# Patient Record
Sex: Female | Born: 1975 | Race: Black or African American | Hispanic: No | Marital: Single | Smoking: Current some day smoker
Health system: Southern US, Community
[De-identification: ages and names within clinical notes are randomized; demographics above are authoritative.]

## PROBLEM LIST (undated history)

## (undated) DIAGNOSIS — F32A Depression, unspecified: Secondary | ICD-10-CM

## (undated) DIAGNOSIS — D509 Iron deficiency anemia, unspecified: Secondary | ICD-10-CM

## (undated) DIAGNOSIS — F329 Major depressive disorder, single episode, unspecified: Secondary | ICD-10-CM

## (undated) DIAGNOSIS — F419 Anxiety disorder, unspecified: Secondary | ICD-10-CM

## (undated) HISTORY — DX: Iron deficiency anemia, unspecified: D50.9

## (undated) HISTORY — DX: Major depressive disorder, single episode, unspecified: F32.9

## (undated) HISTORY — PX: BREAST SURGERY: SHX581

## (undated) HISTORY — DX: Depression, unspecified: F32.A

## (undated) HISTORY — DX: Anxiety disorder, unspecified: F41.9

---

## 2006-07-20 ENCOUNTER — Emergency Department (HOSPITAL_COMMUNITY): Admission: EM | Admit: 2006-07-20 | Discharge: 2006-07-20 | Payer: Self-pay | Admitting: Emergency Medicine

## 2007-02-16 ENCOUNTER — Other Ambulatory Visit: Admission: RE | Admit: 2007-02-16 | Discharge: 2007-02-16 | Payer: Self-pay | Admitting: Obstetrics and Gynecology

## 2007-06-10 HISTORY — PX: MYOMECTOMY: SHX85

## 2007-09-10 ENCOUNTER — Other Ambulatory Visit: Admission: RE | Admit: 2007-09-10 | Discharge: 2007-09-10 | Payer: Self-pay | Admitting: Obstetrics and Gynecology

## 2008-06-09 HISTORY — PX: BOWEL RESECTION: SHX1257

## 2008-06-09 HISTORY — PX: OVARIAN CYST SURGERY: SHX726

## 2008-11-02 ENCOUNTER — Other Ambulatory Visit: Admission: RE | Admit: 2008-11-02 | Discharge: 2008-11-02 | Payer: Self-pay | Admitting: Obstetrics and Gynecology

## 2008-11-13 ENCOUNTER — Encounter: Admission: RE | Admit: 2008-11-13 | Discharge: 2008-11-13 | Payer: Self-pay | Admitting: Obstetrics and Gynecology

## 2008-12-27 ENCOUNTER — Encounter: Admission: RE | Admit: 2008-12-27 | Discharge: 2008-12-27 | Payer: Self-pay | Admitting: Gastroenterology

## 2008-12-28 ENCOUNTER — Encounter: Admission: RE | Admit: 2008-12-28 | Discharge: 2008-12-28 | Payer: Self-pay | Admitting: Gastroenterology

## 2009-05-07 ENCOUNTER — Other Ambulatory Visit: Admission: RE | Admit: 2009-05-07 | Discharge: 2009-05-07 | Payer: Self-pay | Admitting: Obstetrics and Gynecology

## 2009-05-24 ENCOUNTER — Ambulatory Visit (HOSPITAL_COMMUNITY): Admission: RE | Admit: 2009-05-24 | Discharge: 2009-05-24 | Payer: Self-pay | Admitting: Obstetrics and Gynecology

## 2009-07-13 ENCOUNTER — Ambulatory Visit (HOSPITAL_COMMUNITY): Admission: RE | Admit: 2009-07-13 | Discharge: 2009-07-13 | Payer: Self-pay | Admitting: Obstetrics and Gynecology

## 2009-10-15 ENCOUNTER — Other Ambulatory Visit: Admission: RE | Admit: 2009-10-15 | Discharge: 2009-10-15 | Payer: Self-pay | Admitting: Obstetrics and Gynecology

## 2010-06-30 ENCOUNTER — Encounter: Payer: Self-pay | Admitting: Obstetrics and Gynecology

## 2012-07-02 ENCOUNTER — Ambulatory Visit: Payer: BC Managed Care – PPO

## 2012-07-02 ENCOUNTER — Ambulatory Visit (INDEPENDENT_AMBULATORY_CARE_PROVIDER_SITE_OTHER): Payer: BC Managed Care – PPO | Admitting: Family Medicine

## 2012-07-02 VITALS — BP 118/80 | HR 72 | Temp 98.1°F | Resp 16 | Ht 63.0 in | Wt 159.8 lb

## 2012-07-02 DIAGNOSIS — R52 Pain, unspecified: Secondary | ICD-10-CM

## 2012-07-02 DIAGNOSIS — R079 Chest pain, unspecified: Secondary | ICD-10-CM

## 2012-07-02 DIAGNOSIS — D649 Anemia, unspecified: Secondary | ICD-10-CM

## 2012-07-02 LAB — POCT CBC
Granulocyte percent: 50.6 % (ref 37–80)
HCT, POC: 37.5 % — AB (ref 37.7–47.9)
Hemoglobin: 11.5 g/dL — AB (ref 12.2–16.2)
Lymph, poc: 2.3 (ref 0.6–3.4)
MCH, POC: 26.4 pg — AB (ref 27–31.2)
MCHC: 30.7 g/dL — AB (ref 31.8–35.4)
MCV: 86.1 fL (ref 80–97)
MID (cbc): 0.3 (ref 0–0.9)
MPV: 6.7 fL (ref 0–99.8)
POC Granulocyte: 2.7 (ref 2–6.9)
POC LYMPH PERCENT: 43.5 % (ref 10–50)
POC MID %: 5.9 %M (ref 0–12)
Platelet Count, POC: 368 10*3/uL (ref 142–424)
RBC: 4.35 M/uL (ref 4.04–5.48)
RDW, POC: 14.9 %
WBC: 5.4 10*3/uL (ref 4.6–10.2)

## 2012-07-02 NOTE — Patient Instructions (Addendum)
Chest Pain (Nonspecific) It is often hard to give a specific diagnosis for the cause of chest pain. There is always a chance that your pain could be related to something serious, such as a heart attack or a blood clot in the lungs. You need to follow up with your caregiver for further evaluation. CAUSES   Heartburn.  Pneumonia or bronchitis.  Anxiety or stress.  Inflammation around your heart (pericarditis) or lung (pleuritis or pleurisy).  A blood clot in the lung.  A collapsed lung (pneumothorax). It can develop suddenly on its own (spontaneous pneumothorax) or from injury (trauma) to the chest.  Shingles infection (herpes zoster virus). The chest wall is composed of bones, muscles, and cartilage. Any of these can be the source of the pain.  The bones can be bruised by injury.  The muscles or cartilage can be strained by coughing or overwork.  The cartilage can be affected by inflammation and become sore (costochondritis). DIAGNOSIS  Lab tests or other studies, such as X-rays, electrocardiography, stress testing, or cardiac imaging, may be needed to find the cause of your pain.  TREATMENT   Treatment depends on what may be causing your chest pain. Treatment may include:  Acid blockers for heartburn.  Anti-inflammatory medicine.  Pain medicine for inflammatory conditions.  Antibiotics if an infection is present.  You may be advised to change lifestyle habits. This includes stopping smoking and avoiding alcohol, caffeine, and chocolate.  You may be advised to keep your head raised (elevated) when sleeping. This reduces the chance of acid going backward from your stomach into your esophagus.  Most of the time, nonspecific chest pain will improve within 2 to 3 days with rest and mild pain medicine. HOME CARE INSTRUCTIONS   If antibiotics were prescribed, take your antibiotics as directed. Finish them even if you start to feel better.  For the next few days, avoid physical  activities that bring on chest pain. Continue physical activities as directed.  Do not smoke.  Avoid drinking alcohol.  Only take over-the-counter or prescription medicine for pain, discomfort, or fever as directed by your caregiver.  Follow your caregiver's suggestions for further testing if your chest pain does not go away.  Keep any follow-up appointments you made. If you do not go to an appointment, you could develop lasting (chronic) problems with pain. If there is any problem keeping an appointment, you must call to reschedule. SEEK MEDICAL CARE IF:   You think you are having problems from the medicine you are taking. Read your medicine instructions carefully.  Your chest pain does not go away, even after treatment.  You develop a rash with blisters on your chest. SEEK IMMEDIATE MEDICAL CARE IF:   You have increased chest pain or pain that spreads to your arm, neck, jaw, back, or abdomen.  You develop shortness of breath, an increasing cough, or you are coughing up blood.  You have severe back or abdominal pain, feel nauseous, or vomit.  You develop severe weakness, fainting, or chills.  You have a fever. THIS IS AN EMERGENCY. Do not wait to see if the pain will go away. Get medical help at once. Call your local emergency services (911 in [place] Do not drive yourself to the hospital. MAKE SURE YOU:   Understand these instructions.  Will watch your condition.  Will get help right away if you are not doing well or get worse. Document Released: 03/05/2005 Document Revised: 08/18/2011 Document Reviewed: 12/30/2007 Exit[employer] Patient Information 2013 [employer].  

## 2012-07-02 NOTE — Progress Notes (Signed)
[facility]:  Office Visit  Chief Complaint:  Chief Complaint  Patient presents with  . Back Pain    x 1 week  . Chest Pain    x 1 week  . rib pain  . Neck Pain    HPI: Emily Erickson is a 37 y.o. female who complains of  Back pain from bottom of shoulder, to neck to throat ( sore throat). Last night she had pain on the left side of her ribs, her pain in the shoulders radiates down arm. Midsternal CP. Pain is described as dull and achey, intemittent x 1 week. Throbbing pain. She denies diaphoressis, palpitations, numbness/tingling, jaw pain, nausea, vomiting, abd pain . She deneis any MI risk factors ie HTN, XOL, premature MIs in family history. She does not smoke. She drinks socially. She denies any repetitive motions/new exercises. She is a college Retail buyer at [employer] . Has not tried anything for this. She does have a history of anxiety and the CP can come on during her anxiety attacks but this occurred only twice last week. NO new rashes, no insect/tick bites.   Intermittent CP, and pain all over her upper body as above.  1 week history. No new exercises. No lifting anything heavy.  Occurs more during later in the day, as day progesses.  No  courghing, no fevers, chills, no URI sxs. No new abdominal issues No CAD risk factors.    Past Medical History  Diagnosis Date  . Anxiety   . Depression    Past Surgical History  Procedure Date  . Breast surgery   . Myomectomy 2009  . Ovarian cyst surgery 2010  . Bowel resection 2010   History   Social History  . Marital Status: Single    Spouse Name: N/A    Number of Children: N/A  . Years of Education: N/A   Social History Main Topics  . Smoking status: Current Some Day Smoker  . Smokeless tobacco: None  . Alcohol Use: 0.0 oz/week    3-5 drink(s) per week  . Drug Use: No  . Sexually Active:    Other Topics Concern  . None   Social History Narrative  . None   Family History  Problem  Relation Age of Onset  . Hypertension Father    No Known Allergies Prior to Admission medications   Medication Sig Start Date End Date Taking? Authorizing Provider  ALPRAZolam (XANAX) 0.25 MG tablet Take 0.25 mg by mouth at bedtime as needed.   Yes Historical Provider, MD     ROS: The patient denies fevers, chills, night sweats, unintentional weight loss, chest pain, palpitations, wheezing, dyspnea on exertion, nausea, vomiting, abdominal pain, dysuria, hematuria, melena, numbness, weakness, or tingling.   All other systems have been reviewed and were otherwise negative with the exception of those mentioned in the HPI and as above.    PHYSICAL EXAM: Filed Vitals:   07/02/12 1804  BP: 118/80  Pulse: 72  Temp: 98.1 F (36.7 C)  Resp: 16   Filed Vitals:   07/02/12 1804  Height: 5\' 3"  (1.6 m)  Weight: 159 lb 12.8 oz (72.485 kg)  SPo2   98% Body mass index is 28.31 kg/(m^2).  General: Alert, no acute distress HEENT:  Normocephalic, atraumatic, oropharynx patent. EOMI, PERRLA, fundoscopic exam nl. Cardiovascular:  Regular rate and rhythm, no rubs murmurs or gallops.  No Carotid bruits, radial pulse intact. No pedal edema.  Respiratory: Clear to auscultation bilaterally.  No  wheezes, rales, or rhonchi.  No cyanosis, no use of accessory musculature GI: No organomegaly, abdomen is soft and non-tender, positive bowel sounds.  No masses. Skin: No rashes. Neurologic: Facial musculature symmetric. CN 2-12 grossly intact Psychiatric: Patient is appropriate throughout our interaction. Lymphatic: No cervical lymphadenopathy Musculoskeletal: Gait intact. Neck exam-normal ROM, 5/5 strength Shoulder bilateral-normal Thoracic exam-bl, tender at trapezius  LABS: Results for orders placed in visit on 07/02/12  POCT CBC      Component Value Range   WBC 5.4  4.6 - 10.2 K/uL   Lymph, poc 2.3  0.6 - 3.4   POC LYMPH PERCENT 43.5  10 - 50 %L   MID (cbc) 0.3  0 - 0.9   POC MID % 5.9  0 - 12 %M    POC Granulocyte 2.7  2 - 6.9   Granulocyte percent 50.6  37 - 80 %G   RBC 4.35  4.04 - 5.48 M/uL   Hemoglobin 11.5 (*) 12.2 - 16.2 g/dL   HCT, POC 78.2 (*) 95.6 - 47.9 %   MCV 86.1  80 - 97 fL   MCH, POC 26.4 (*) 27 - 31.2 pg   MCHC 30.7 (*) 31.8 - 35.4 g/dL   RDW, POC 21.3     Platelet Count, POC 368  142 - 424 K/uL   MPV 6.7  0 - 99.8 fL     EKG/XRAY:   Primary read interpreted by Dr. Conley Rolls at [facility] No acute cardiopulmonary process EKG NSR  ASSESSMENT/PLAN: Encounter Diagnoses  Name Primary?  . Chest pain Yes  . Anemia    Msk pain x 1 week ? Etiology  Chest pain-evaluate anemia vs anxiety. EKG and CXR did not show anything actue Labs pending: CMP,  iron studies CXR and EKG normal F/u prn    Mikel Hardgrove PHUONG, DO 07/02/2012 6:55 PM

## 2012-07-03 LAB — COMPREHENSIVE METABOLIC PANEL WITH GFR
ALT: 17 U/L (ref 0–35)
Albumin: 4.2 g/dL (ref 3.5–5.2)
Alkaline Phosphatase: 103 U/L (ref 39–117)
BUN: 11 mg/dL (ref 6–23)
CO2: 24 meq/L (ref 19–32)
Calcium: 9.5 mg/dL (ref 8.4–10.5)
Creat: 0.72 mg/dL (ref 0.50–1.10)
Glucose, Bld: 78 mg/dL (ref 70–99)
Total Bilirubin: 0.2 mg/dL — ABNORMAL LOW (ref 0.3–1.2)

## 2012-07-03 LAB — IRON AND TIBC
%SAT: 13 % — ABNORMAL LOW (ref 20–55)
Iron: 49 ug/dL (ref 42–145)
TIBC: 370 ug/dL (ref 250–470)
UIBC: 321 ug/dL (ref 125–400)

## 2012-07-03 LAB — COMPREHENSIVE METABOLIC PANEL
AST: 17 U/L (ref 0–37)
Chloride: 105 mEq/L (ref 96–112)
Potassium: 3.8 mEq/L (ref 3.5–5.3)
Sodium: 137 mEq/L (ref 135–145)
Total Protein: 6.9 g/dL (ref 6.0–8.3)

## 2012-07-03 LAB — FERRITIN: Ferritin: 7 ng/mL — ABNORMAL LOW (ref 10–291)

## 2012-07-14 ENCOUNTER — Telehealth: Payer: Self-pay | Admitting: Family Medicine

## 2012-07-14 NOTE — Telephone Encounter (Signed)
LM to call me back about labs. CMP was normal. Her iron studies were low. She needs iron supplements ( iron 325 mg daily)  for 2 months and then repeat CBC/iron studies.

## 2012-08-23 ENCOUNTER — Encounter: Payer: Self-pay | Admitting: Family Medicine

## 2013-01-11 ENCOUNTER — Ambulatory Visit (INDEPENDENT_AMBULATORY_CARE_PROVIDER_SITE_OTHER): Payer: BC Managed Care – PPO | Admitting: Family Medicine

## 2013-01-11 VITALS — BP 120/88 | HR 82 | Temp 98.1°F | Resp 16 | Ht 64.0 in | Wt 163.8 lb

## 2013-01-11 DIAGNOSIS — T148XXA Other injury of unspecified body region, initial encounter: Secondary | ICD-10-CM

## 2013-01-11 DIAGNOSIS — M542 Cervicalgia: Secondary | ICD-10-CM

## 2013-01-11 MED ORDER — TRAMADOL HCL 50 MG PO TABS: 50.0000 mg | ORAL_TABLET | Freq: Three times a day (TID) | ORAL | Status: DC | PRN

## 2013-01-11 MED ORDER — CYCLOBENZAPRINE HCL 5 MG PO TABS: 5.0000 mg | ORAL_TABLET | Freq: Every evening | ORAL | Status: DC | PRN

## 2013-01-11 MED ORDER — NAPROXEN 500 MG PO TABS: 500.0000 mg | ORAL_TABLET | Freq: Two times a day (BID) | ORAL | Status: DC
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
## 2013-01-11 NOTE — Patient Instructions (Signed)
Cervical Strain and Sprain (Whiplash) with Rehab Cervical strain and sprains are injuries that commonly occur with "whiplash" injuries. Whiplash occurs when the neck is forcefully whipped backward or forward, such as during a motor vehicle accident. The muscles, ligaments, tendons, discs and nerves of the neck are susceptible to injury when this occurs. SYMPTOMS   Pain or stiffness in the front and/or back of neck  Symptoms may present immediately or up to 24 hours after injury.  Dizziness, headache, nausea and vomiting.  Muscle spasm with soreness and stiffness in the neck.  Tenderness and swelling at the injury site. CAUSES  Whiplash injuries often occur during contact sports or motor vehicle accidents.  RISK INCREASES WITH:  Osteoarthritis of the spine.  Situations that make head or neck accidents or trauma more likely.  High-risk sports (football, rugby, wrestling, hockey, auto racing, gymnastics, diving, contact karate or boxing).  Poor strength and flexibility of the neck.  Previous neck injury.  Poor tackling technique.  Improperly fitted or padded equipment. PREVENTION  Learn and use proper technique (avoid tackling with the head, spearing and head-butting; use proper falling techniques to avoid landing on the head).  Warm up and stretch properly before activity.  Maintain physical fitness:  Strength, flexibility and endurance.  Cardiovascular fitness.  Wear properly fitted and padded protective equipment, such as padded soft collars, for participation in contact sports. PROGNOSIS  Recovery for cervical strain and sprain injuries is dependent on the extent of the injury. These injuries are usually curable in 1 week to 3 months with appropriate treatment.  RELATED COMPLICATIONS   Temporary numbness and weakness may occur if the nerve roots are damaged, and this may persist until the nerve has completely healed.  Chronic pain due to frequent recurrence of  symptoms.  Prolonged healing, especially if activity is resumed too soon (before complete recovery). TREATMENT  Treatment initially involves the use of ice and medication to help reduce pain and inflammation. It is also important to perform strengthening and stretching exercises and modify activities that worsen symptoms so the injury does not get worse. These exercises may be performed at home or with a therapist. For patients who experience severe symptoms, a soft padded collar may be recommended to be worn around the neck.  Improving your posture may help reduce symptoms. Posture improvement includes pulling your chin and abdomen in while sitting or standing. If you are sitting, sit in a firm chair with your buttocks against the back of the chair. While sleeping, try replacing your pillow with a small towel rolled to 2 inches in diameter, or use a cervical pillow or soft cervical collar. Poor sleeping positions delay healing.  For patients with nerve root damage, which causes numbness or weakness, the use of a cervical traction apparatus may be recommended. Surgery is rarely necessary for these injuries. However, cervical strain and sprains that are present at birth (congenital) may require surgery. MEDICATION   If pain medication is necessary, nonsteroidal anti-inflammatory medications, such as aspirin and ibuprofen, or other minor pain relievers, such as acetaminophen, are often recommended.  Do not take pain medication for 7 days before surgery.  Prescription pain relievers may be given if deemed necessary by your caregiver. Use only as directed and only as much as you need. HEAT AND COLD:   Cold treatment (icing) relieves pain and reduces inflammation. Cold treatment should be applied for 10 to 15 minutes every 2 to 3 hours for inflammation and pain and immediately after any activity that   aggravates your symptoms. Use ice packs or an ice massage.  Heat treatment may be used prior to  performing the stretching and strengthening activities prescribed by your caregiver, physical therapist, or athletic trainer. Use a heat pack or a warm soak. SEEK MEDICAL CARE IF:   Symptoms get worse or do not improve in 2 weeks despite treatment.  New, unexplained symptoms develop (drugs used in treatment may produce side effects). EXERCISES RANGE OF MOTION (ROM) AND STRETCHING EXERCISES - Cervical Strain and Sprain These exercises may help you when beginning to rehabilitate your injury. In order to successfully resolve your symptoms, you must improve your posture. These exercises are designed to help reduce the forward-head and rounded-shoulder posture which contributes to this condition. Your symptoms may resolve with or without further involvement from your physician, physical therapist or athletic trainer. While completing these exercises, remember:   Restoring tissue flexibility helps normal motion to return to the joints. This allows healthier, less painful movement and activity.  An effective stretch should be held for at least 20 seconds, although you may need to begin with shorter hold times for comfort.  A stretch should never be painful. You should only feel a gentle lengthening or release in the stretched tissue. STRETCH- Axial Extensors  Lie on your back on the floor. You may bend your knees for comfort. Place a rolled up hand towel or dish towel, about 2 inches in diameter, under the part of your head that makes contact with the floor.  Gently tuck your chin, as if trying to make a "double chin," until you feel a gentle stretch at the base of your head.  Hold __________ seconds. Repeat __________ times. Complete this exercise __________ times per day.  STRETECH - Axial Extension   Stand or sit on a firm surface. Assume a good posture: chest up, shoulders drawn back, abdominal muscles slightly tense, knees unlocked (if standing) and feet hip width apart.  Slowly retract your  chin so your head slides back and your chin slightly lowers.Continue to look straight ahead.  You should feel a gentle stretch in the back of your head. Be certain not to feel an aggressive stretch since this can cause headaches later.  Hold for __________ seconds. Repeat __________ times. Complete this exercise __________ times per day. STRETCH  Cervical Side Bend   Stand or sit on a firm surface. Assume a good posture: chest up, shoulders drawn back, abdominal muscles slightly tense, knees unlocked (if standing) and feet hip width apart.  Without letting your nose or shoulders move, slowly tip your right / left ear to your shoulder until your feel a gentle stretch in the muscles on the opposite side of your neck.  Hold __________ seconds. Repeat __________ times. Complete this exercise __________ times per day. STRETCH  Cervical Rotators   Stand or sit on a firm surface. Assume a good posture: chest up, shoulders drawn back, abdominal muscles slightly tense, knees unlocked (if standing) and feet hip width apart.  Keeping your eyes level with the ground, slowly turn your head until you feel a gentle stretch along the back and opposite side of your neck.  Hold __________ seconds. Repeat __________ times. Complete this exercise __________ times per day. RANGE OF MOTION - Neck Circles   Stand or sit on a firm surface. Assume a good posture: chest up, shoulders drawn back, abdominal muscles slightly tense, knees unlocked (if standing) and feet hip width apart.  Gently roll your head down and around from   the back of one shoulder to the back of the other. The motion should never be forced or painful.  Repeat the motion 10-20 times, or until you feel the neck muscles relax and loosen. Repeat __________ times. Complete the exercise __________ times per day. STRENGTHENING EXERCISES - Cervical Strain and Sprain These exercises may help you when beginning to rehabilitate your injury. They may  resolve your symptoms with or without further involvement from your physician, physical therapist or athletic trainer. While completing these exercises, remember:   Muscles can gain both the endurance and the strength needed for everyday activities through controlled exercises.  Complete these exercises as instructed by your physician, physical therapist or athletic trainer. Progress the resistance and repetitions only as guided.  You may experience muscle soreness or fatigue, but the pain or discomfort you are trying to eliminate should never worsen during these exercises. If this pain does worsen, stop and make certain you are following the directions exactly. If the pain is still present after adjustments, discontinue the exercise until you can discuss the trouble with your clinician. STRENGTH Cervical Flexors, Isometric  Face a wall, standing about 6 inches away. Place a small pillow, a ball about 6-8 inches in diameter, or a folded towel between your forehead and the wall.  Slightly tuck your chin and gently push your forehead into the soft object. Push only with mild to moderate intensity, building up tension gradually. Keep your jaw and forehead relaxed.  Hold 10 to 20 seconds. Keep your breathing relaxed.  Release the tension slowly. Relax your neck muscles completely before you start the next repetition. Repeat __________ times. Complete this exercise __________ times per day. STRENGTH- Cervical Lateral Flexors, Isometric   Stand about 6 inches away from a wall. Place a small pillow, a ball about 6-8 inches in diameter, or a folded towel between the side of your head and the wall.  Slightly tuck your chin and gently tilt your head into the soft object. Push only with mild to moderate intensity, building up tension gradually. Keep your jaw and forehead relaxed.  Hold 10 to 20 seconds. Keep your breathing relaxed.  Release the tension slowly. Relax your neck muscles completely before  you start the next repetition. Repeat __________ times. Complete this exercise __________ times per day. STRENGTH  Cervical Extensors, Isometric   Stand about 6 inches away from a wall. Place a small pillow, a ball about 6-8 inches in diameter, or a folded towel between the back of your head and the wall.  Slightly tuck your chin and gently tilt your head back into the soft object. Push only with mild to moderate intensity, building up tension gradually. Keep your jaw and forehead relaxed.  Hold 10 to 20 seconds. Keep your breathing relaxed.  Release the tension slowly. Relax your neck muscles completely before you start the next repetition. Repeat __________ times. Complete this exercise __________ times per day. POSTURE AND BODY MECHANICS CONSIDERATIONS - Cervical Strain and Sprain Keeping correct posture when sitting, standing or completing your activities will reduce the stress put on different body tissues, allowing injured tissues a chance to heal and limiting painful experiences. The following are general guidelines for improved posture. Your physician or physical therapist will provide you with any instructions specific to your needs. While reading these guidelines, remember:  The exercises prescribed by your provider will help you have the flexibility and strength to maintain correct postures.  The correct posture provides the optimal environment for your joints to   work. All of your joints have less wear and tear when properly supported by a spine with good posture. This means you will experience a healthier, less painful body.  Correct posture must be practiced with all of your activities, especially prolonged sitting and standing. Correct posture is as important when doing repetitive low-stress activities (typing) as it is when doing a single heavy-load activity (lifting). PROLONGED STANDING WHILE SLIGHTLY LEANING FORWARD When completing a task that requires you to lean forward while  standing in one place for a long time, place either foot up on a stationary 2-4 inch high object to help maintain the best posture. When both feet are on the ground, the low back tends to lose its slight inward curve. If this curve flattens (or becomes too large), then the back and your other joints will experience too much stress, fatigue more quickly and can cause pain.  RESTING POSITIONS Consider which positions are most painful for you when choosing a resting position. If you have pain with flexion-based activities (sitting, bending, stooping, squatting), choose a position that allows you to rest in a less flexed posture. You would want to avoid curling into a fetal position on your side. If your pain worsens with extension-based activities (prolonged standing, working overhead), avoid resting in an extended position such as sleeping on your stomach. Most people will find more comfort when they rest with their spine in a more neutral position, neither too rounded nor too arched. Lying on a non-sagging bed on your side with a pillow between your knees, or on your back with a pillow under your knees will often provide some relief. Keep in mind, being in any one position for a prolonged period of time, no matter how correct your posture, can still lead to stiffness. WALKING Walk with an upright posture. Your ears, shoulders and hips should all line-up. OFFICE WORK When working at a desk, create an environment that supports good, upright posture. Without extra support, muscles fatigue and lead to excessive strain on joints and other tissues. CHAIR:  A chair should be able to slide under your desk when your back makes contact with the back of the chair. This allows you to work closely.  The chair's height should allow your eyes to be level with the upper part of your monitor and your hands to be slightly lower than your elbows.  Body position:  Your feet should make contact with the floor. If this is  not possible, use a foot rest.  Keep your ears over your shoulders. This will reduce stress on your neck and low back. Document Released: 05/26/2005 Document Revised: 08/18/2011 Document Reviewed: 09/07/2008 [employer] Patient Information 2014 [employer].  

## 2013-01-19 ENCOUNTER — Other Ambulatory Visit: Payer: Self-pay | Admitting: *Deleted

## 2013-01-19 DIAGNOSIS — T148XXA Other injury of unspecified body region, initial encounter: Secondary | ICD-10-CM

## 2013-01-19 DIAGNOSIS — M542 Cervicalgia: Secondary | ICD-10-CM

## 2013-01-19 MED ORDER — NAPROXEN 500 MG PO TABS: 500.0000 mg | ORAL_TABLET | Freq: Two times a day (BID) | ORAL | Status: DC

## 2013-01-19 MED ORDER — CYCLOBENZAPRINE HCL 5 MG PO TABS: 5.0000 mg | ORAL_TABLET | Freq: Every evening | ORAL | Status: DC | PRN

## 2013-01-25 ENCOUNTER — Ambulatory Visit: Payer: BC Managed Care – PPO

## 2013-01-25 ENCOUNTER — Ambulatory Visit (INDEPENDENT_AMBULATORY_CARE_PROVIDER_SITE_OTHER): Payer: BC Managed Care – PPO | Admitting: Family Medicine

## 2013-01-25 VITALS — BP 138/84 | HR 93 | Temp 98.4°F | Resp 18 | Wt 164.0 lb

## 2013-01-25 DIAGNOSIS — T148XXA Other injury of unspecified body region, initial encounter: Secondary | ICD-10-CM

## 2013-01-25 DIAGNOSIS — M436 Torticollis: Secondary | ICD-10-CM
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
## 2013-02-28 DIAGNOSIS — Z0271 Encounter for disability determination: Secondary | ICD-10-CM

## 2013-04-21 ENCOUNTER — Other Ambulatory Visit: Payer: Self-pay | Admitting: Obstetrics & Gynecology

## 2013-04-21 DIAGNOSIS — D259 Leiomyoma of uterus, unspecified: Secondary | ICD-10-CM

## 2013-04-30 ENCOUNTER — Other Ambulatory Visit: Payer: Self-pay

## 2013-05-03 ENCOUNTER — Telehealth: Payer: Self-pay

## 2013-05-03 DIAGNOSIS — T148XXA Other injury of unspecified body region, initial encounter: Secondary | ICD-10-CM

## 2013-05-03 DIAGNOSIS — M542 Cervicalgia: Secondary | ICD-10-CM

## 2013-05-03 NOTE — Telephone Encounter (Signed)
Please advise 

## 2013-05-03 NOTE — Telephone Encounter (Signed)
Dr Conley Rolls  Patient is requesting tramadol   [phone]  [facility]

## 2013-05-06 MED ORDER — TRAMADOL HCL 50 MG PO TABS: 50.0000 mg | ORAL_TABLET | Freq: Three times a day (TID) | ORAL | Status: DC | PRN

## 2013-05-06 NOTE — Telephone Encounter (Signed)
Done

## 2013-06-20 ENCOUNTER — Telehealth: Payer: Self-pay

## 2013-06-20 NOTE — Telephone Encounter (Signed)
[facility] Attending Physician's Report in Dr. Gus Puma box to be completed.

## 2013-06-30 NOTE — Telephone Encounter (Signed)
[facility] did not received ppwk so they sent another copy which is in Dr. Gus Puma box to be completed.

## 2013-06-30 NOTE — Telephone Encounter (Signed)
Ppwk completed. Faxed copy to [facility]. [place] and left patient a copy in pickup drawer.

## 2013-07-11 ENCOUNTER — Ambulatory Visit (INDEPENDENT_AMBULATORY_CARE_PROVIDER_SITE_OTHER): Payer: BC Managed Care – PPO | Admitting: Family Medicine

## 2013-07-11 VITALS — BP 120/80 | HR 89 | Temp 98.2°F | Resp 16 | Ht 64.0 in | Wt 151.0 lb

## 2013-07-11 DIAGNOSIS — M542 Cervicalgia: Secondary | ICD-10-CM | POA: Diagnosis not present

## 2013-07-11 DIAGNOSIS — T148XXA Other injury of unspecified body region, initial encounter: Secondary | ICD-10-CM | POA: Diagnosis not present

## 2013-07-11 MED ORDER — DICLOFENAC SODIUM 1 % TD GEL: 2.0000 g | Freq: Four times a day (QID) | TRANSDERMAL | Status: DC

## 2013-07-11 MED ORDER — METHOCARBAMOL 500 MG PO TABS: 500.0000 mg | ORAL_TABLET | Freq: Three times a day (TID) | ORAL | Status: DC | PRN

## 2013-07-11 MED ORDER — HYDROCODONE-ACETAMINOPHEN 5-325 MG PO TABS: 1.0000 | ORAL_TABLET | Freq: Every evening | ORAL | Status: DC | PRN
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
## 2013-07-11 NOTE — Progress Notes (Deleted)
Chief Complaint:  Chief Complaint  Patient presents with  . Follow-up    Neck pain now in front of neck    HPI: Emily Erickson is a 38 y.o. female who is here for  ***  Past Medical History  Diagnosis Date  . Anxiety   . Depression    Past Surgical History  Procedure Laterality Date  . Breast surgery    . Myomectomy  2009  . Ovarian cyst surgery  2010  . Bowel resection  2010   History   Social History  . Marital Status: Single    Spouse Name: N/A    Number of Children: N/A  . Years of Education: N/A   Social History Main Topics  . Smoking status: Current Some Day Smoker  . Smokeless tobacco: None  . Alcohol Use: 0.0 oz/week    3-5 drink(s) per week  . Drug Use: No  . Sexual Activity: None   Other Topics Concern  . None   Social History Narrative  . None   Family History  Problem Relation Age of Onset  . Hypertension Father    No Known Allergies Prior to Admission medications   Medication Sig Start Date End Date Taking? Authorizing Provider  ALPRAZolam (XANAX) 0.25 MG tablet Take 0.25 mg by mouth at bedtime as needed.   Yes Historical Provider, MD  traMADol (ULTRAM) 50 MG tablet Take 1 tablet (50 mg total) by mouth every 8 (eight) hours as needed. 05/03/13  Yes Thao P Le, DO  cyclobenzaprine (FLEXERIL) 5 MG tablet Take 1 tablet (5 mg total) by mouth at bedtime as needed. 01/19/13   Thao P Le, DO  naproxen (NAPROSYN) 500 MG tablet Take 1 tablet (500 mg total) by mouth 2 (two) times daily with a meal. 01/19/13   Thao P Le, DO     ROS: The patient denies fevers, chills, night sweats, unintentional weight loss, chest pain, palpitations, wheezing, dyspnea on exertion, nausea, vomiting, abdominal pain, dysuria, hematuria, melena, numbness, weakness, or tingling. ***  All other systems have been reviewed and were otherwise negative with the exception of those mentioned in the HPI and as above.    PHYSICAL EXAM: Filed Vitals:   07/11/13 1649  BP: 120/80    Pulse: 89  Temp: 98.2 F (36.8 C)  Resp: 16   Filed Vitals:   07/11/13 1649  Height: 5\' 4"  (1.626 m)  Weight: 151 lb (68.493 kg)   Body mass index is 25.91 kg/(m^2).  General: Alert, no acute distress HEENT:  Normocephalic, atraumatic, oropharynx patent. EOMI, PERRLA Cardiovascular:  Regular rate and rhythm, no rubs murmurs or gallops.  No Carotid bruits, radial pulse intact. No pedal edema.  Respiratory: Clear to auscultation bilaterally.  No wheezes, rales, or rhonchi.  No cyanosis, no use of accessory musculature GI: No organomegaly, abdomen is soft and non-tender, positive bowel sounds.  No masses. Skin: No rashes. Neurologic: Facial musculature symmetric. Psychiatric: Patient is appropriate throughout our interaction. Lymphatic: No cervical lymphadenopathy Musculoskeletal: Gait intact.   LABS: Results for orders placed in visit on 07/02/12  COMPREHENSIVE METABOLIC PANEL      Result Value Range   Sodium 137  135 - 145 mEq/L   Potassium 3.8  3.5 - 5.3 mEq/L   Chloride 105  96 - 112 mEq/L   CO2 24  19 - 32 mEq/L   Glucose, Bld 78  70 - 99 mg/dL   BUN 11  6 - 23 mg/dL   Creat  0.72  0.50 - 1.10 mg/dL   Total Bilirubin 0.2 (*) 0.3 - 1.2 mg/dL   Alkaline Phosphatase 103  39 - 117 U/L   AST 17  0 - 37 U/L   ALT 17  0 - 35 U/L   Total Protein 6.9  6.0 - 8.3 g/dL   Albumin 4.2  3.5 - 5.2 g/dL   Calcium 9.5  8.4 - 10.5 mg/dL  FERRITIN      Result Value Range   Ferritin 7 (*) 10 - 291 ng/mL  IRON AND TIBC      Result Value Range   Iron 49  42 - 145 ug/dL   UIBC 321  125 - 400 ug/dL   TIBC 370  250 - 470 ug/dL   %SAT 13 (*) 20 - 55 %  POCT CBC      Result Value Range   WBC 5.4  4.6 - 10.2 K/uL   Lymph, poc 2.3  0.6 - 3.4   POC LYMPH PERCENT 43.5  10 - 50 %L   MID (cbc) 0.3  0 - 0.9   POC MID % 5.9  0 - 12 %M   POC Granulocyte 2.7  2 - 6.9   Granulocyte percent 50.6  37 - 80 %G   RBC 4.35  4.04 - 5.48 M/uL   Hemoglobin 11.5 (*) 12.2 - 16.2 g/dL   HCT, POC 37.5  (*) 37.7 - 47.9 %   MCV 86.1  80 - 97 fL   MCH, POC 26.4 (*) 27 - 31.2 pg   MCHC 30.7 (*) 31.8 - 35.4 g/dL   RDW, POC 14.9     Platelet Count, POC 368  142 - 424 K/uL   MPV 6.7  0 - 99.8 fL     EKG/XRAY:   Primary read interpreted by Dr. Marin Comment at [facility].   ASSESSMENT/PLAN: No diagnosis found.   Gross sideeffects, risk and benefits, and alternatives of medications d/w patient. Patient is aware that all medications have potential sideeffects and we are unable to predict every sideeffect or drug-drug interaction that may occur.  Dan Humphreys, [facility] 07/11/2013 4:51 PM

## 2013-07-14 ENCOUNTER — Telehealth: Payer: Self-pay

## 2013-07-14 NOTE — Telephone Encounter (Signed)
PA needed for voltaren gel. Completed on covermymeds.

## 2013-07-19 NOTE — Telephone Encounter (Signed)
PA was denied. I have put denial letter in Dr Gus Puma box for review.

## 2013-07-26 ENCOUNTER — Other Ambulatory Visit: Payer: Self-pay | Admitting: Family Medicine

## 2013-07-26 DIAGNOSIS — IMO0002 Reserved for concepts with insufficient information to code with codable children: Secondary | ICD-10-CM

## 2013-07-26 MED ORDER — DICLOFENAC EPOLAMINE 1.3 % TD PTCH: 1.0000 | MEDICATED_PATCH | Freq: Two times a day (BID) | TRANSDERMAL | Status: AC

## 2013-07-26 NOTE — Telephone Encounter (Signed)
Advised pt that coverage of voltaren gel was denied and that Dr Marin Comment had sent in a different Rx. Pt reported that pharm called and said that the flector patch also needs a PA. Pt reported she has tried Naproxen but no other NSAIDS. The Ultram makes her nauseated, but she has been able to tolerate Norco when taken w/food. I will complete PA when get the info from pharm.

## 2013-07-28 NOTE — Telephone Encounter (Signed)
PA was approved for flector patches through 09/25/13. Notified pt and pharm.

## 2013-08-05 ENCOUNTER — Ambulatory Visit (INDEPENDENT_AMBULATORY_CARE_PROVIDER_SITE_OTHER): Payer: BC Managed Care – PPO | Admitting: Family Medicine

## 2013-08-05 VITALS — BP 135/88 | HR 94 | Temp 98.4°F | Resp 16 | Ht 64.0 in | Wt 153.0 lb

## 2013-08-05 DIAGNOSIS — J029 Acute pharyngitis, unspecified: Secondary | ICD-10-CM

## 2013-08-05 DIAGNOSIS — D509 Iron deficiency anemia, unspecified: Secondary | ICD-10-CM

## 2013-08-05 DIAGNOSIS — M549 Dorsalgia, unspecified: Secondary | ICD-10-CM

## 2013-08-05 DIAGNOSIS — R5383 Other fatigue: Secondary | ICD-10-CM

## 2013-08-05 DIAGNOSIS — J329 Chronic sinusitis, unspecified: Secondary | ICD-10-CM

## 2013-08-05 DIAGNOSIS — R5381 Other malaise: Secondary | ICD-10-CM

## 2013-08-05 DIAGNOSIS — M542 Cervicalgia: Secondary | ICD-10-CM

## 2013-08-05 DIAGNOSIS — T148XXA Other injury of unspecified body region, initial encounter: Secondary | ICD-10-CM

## 2013-08-05 LAB — CBC
HCT: 30.3 % — ABNORMAL LOW (ref 36.0–46.0)
Hemoglobin: 9.6 g/dL — ABNORMAL LOW (ref 12.0–15.0)
MCH: 22.5 pg — ABNORMAL LOW (ref 26.0–34.0)
MCHC: 31.7 g/dL (ref 30.0–36.0)
MCV: 71 fL — ABNORMAL LOW (ref 78.0–100.0)
Platelets: 325 10*3/uL (ref 150–400)
RBC: 4.27 MIL/uL (ref 3.87–5.11)
RDW: 18.2 % — ABNORMAL HIGH (ref 11.5–15.5)
WBC: 3.3 10*3/uL — ABNORMAL LOW (ref 4.0–10.5)

## 2013-08-05 LAB — TSH: TSH: 1.077 u[IU]/mL (ref 0.350–4.500)

## 2013-08-05 LAB — POCT RAPID STREP A (OFFICE): Rapid Strep A Screen: NEGATIVE

## 2013-08-05 MED ORDER — CYCLOBENZAPRINE HCL 5 MG PO TABS: 5.0000 mg | ORAL_TABLET | Freq: Every evening | ORAL | Status: AC | PRN

## 2013-08-05 MED ORDER — FLUTICASONE PROPIONATE 50 MCG/ACT NA SUSP: 2.0000 | Freq: Every day | NASAL | Status: DC
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
## 2013-08-08 ENCOUNTER — Telehealth: Payer: Self-pay | Admitting: Family Medicine

## 2013-08-08 ENCOUNTER — Encounter: Payer: Self-pay | Admitting: Family Medicine

## 2013-08-08 NOTE — Telephone Encounter (Signed)
Spoke to patient about labs, her CBC was abnormal. She is going to take iron 325 mg BID x 1 month and then return to get her CBC rechecked. Her ear pressure is still bothering her left, right changes. She is going to try flonase for 1 month and see how she does. Other labs were normal. She tried flector patch once. Advise to take flexeril at night and flector patch during daytime

## 2013-09-23 ENCOUNTER — Encounter: Payer: Self-pay | Admitting: Family Medicine

## 2013-09-23 ENCOUNTER — Ambulatory Visit: Payer: BC Managed Care – PPO

## 2013-09-23 ENCOUNTER — Ambulatory Visit (INDEPENDENT_AMBULATORY_CARE_PROVIDER_SITE_OTHER): Payer: BC Managed Care – PPO | Admitting: Family Medicine

## 2013-09-23 VITALS — BP 120/80 | HR 83 | Temp 99.0°F | Resp 16 | Ht 63.5 in | Wt 152.0 lb

## 2013-09-23 DIAGNOSIS — R0989 Other specified symptoms and signs involving the circulatory and respiratory systems: Secondary | ICD-10-CM

## 2013-09-23 DIAGNOSIS — R6889 Other general symptoms and signs: Secondary | ICD-10-CM

## 2013-09-23 DIAGNOSIS — Z8639 Personal history of other endocrine, nutritional and metabolic disease: Secondary | ICD-10-CM

## 2013-09-23 DIAGNOSIS — Z862 Personal history of diseases of the blood and blood-forming organs and certain disorders involving the immune mechanism: Secondary | ICD-10-CM

## 2013-09-23 DIAGNOSIS — T148XXA Other injury of unspecified body region, initial encounter: Secondary | ICD-10-CM

## 2013-09-23 DIAGNOSIS — M542 Cervicalgia: Secondary | ICD-10-CM

## 2013-09-23 DIAGNOSIS — D509 Iron deficiency anemia, unspecified: Secondary | ICD-10-CM

## 2013-09-23 DIAGNOSIS — H9209 Otalgia, unspecified ear: Secondary | ICD-10-CM

## 2013-09-23 LAB — CBC
HCT: 37.8 % (ref 36.0–46.0)
Hemoglobin: 12.7 g/dL (ref 12.0–15.0)
MCH: 26.5 pg (ref 26.0–34.0)
MCHC: 33.6 g/dL (ref 30.0–36.0)
MCV: 78.8 fL (ref 78.0–100.0)
Platelets: 287 10*3/uL (ref 150–400)
RBC: 4.8 MIL/uL (ref 3.87–5.11)
RDW: 23.1 % — ABNORMAL HIGH (ref 11.5–15.5)
WBC: 4.3 10*3/uL (ref 4.0–10.5)
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
## 2013-09-27 MED ORDER — HYDROCODONE-ACETAMINOPHEN 5-325 MG PO TABS: 1.0000 | ORAL_TABLET | Freq: Every evening | ORAL | Status: AC | PRN

## 2013-09-28 ENCOUNTER — Ambulatory Visit
Admission: RE | Admit: 2013-09-28 | Discharge: 2013-09-28 | Disposition: A | Discharge status: Home / Self Care | Payer: BC Managed Care – PPO | Source: Ambulatory Visit | Attending: Family Medicine | Admitting: Family Medicine

## 2013-09-28 DIAGNOSIS — Z8639 Personal history of other endocrine, nutritional and metabolic disease: Secondary | ICD-10-CM

## 2013-10-14 ENCOUNTER — Other Ambulatory Visit: Payer: Self-pay | Admitting: Family Medicine

## 2013-10-14 DIAGNOSIS — J329 Chronic sinusitis, unspecified: Secondary | ICD-10-CM

## 2013-10-14 MED ORDER — FLUTICASONE PROPIONATE 50 MCG/ACT NA SUSP: 2.0000 | Freq: Every day | NASAL | Status: AC

## 2013-10-14 NOTE — Telephone Encounter (Signed)
Sent RFs and notified pt via MyChart

## 2014-07-21 ENCOUNTER — Emergency Department (HOSPITAL_COMMUNITY): Payer: BLUE CROSS/BLUE SHIELD

## 2014-07-21 ENCOUNTER — Emergency Department (HOSPITAL_COMMUNITY)
Admission: EM | Admit: 2014-07-21 | Discharge: 2014-07-22 | Disposition: A | Discharge status: Home / Self Care | Payer: BLUE CROSS/BLUE SHIELD | Attending: Emergency Medicine | Admitting: Emergency Medicine

## 2014-07-21 ENCOUNTER — Encounter (HOSPITAL_COMMUNITY): Payer: Self-pay | Admitting: *Deleted

## 2014-07-21 DIAGNOSIS — R0789 Other chest pain: Secondary | ICD-10-CM | POA: Diagnosis not present

## 2014-07-21 DIAGNOSIS — Z862 Personal history of diseases of the blood and blood-forming organs and certain disorders involving the immune mechanism: Secondary | ICD-10-CM | POA: Diagnosis not present

## 2014-07-21 DIAGNOSIS — R05 Cough: Secondary | ICD-10-CM | POA: Diagnosis not present

## 2014-07-21 DIAGNOSIS — Z3202 Encounter for pregnancy test, result negative: Secondary | ICD-10-CM | POA: Insufficient documentation

## 2014-07-21 DIAGNOSIS — Z7951 Long term (current) use of inhaled steroids: Secondary | ICD-10-CM | POA: Insufficient documentation

## 2014-07-21 DIAGNOSIS — R42 Dizziness and giddiness: Secondary | ICD-10-CM | POA: Diagnosis not present

## 2014-07-21 DIAGNOSIS — Z720 Tobacco use: Secondary | ICD-10-CM | POA: Diagnosis not present

## 2014-07-21 DIAGNOSIS — R079 Chest pain, unspecified: Secondary | ICD-10-CM | POA: Diagnosis present

## 2014-07-21 DIAGNOSIS — M791 Myalgia: Secondary | ICD-10-CM | POA: Diagnosis not present

## 2014-07-21 LAB — BASIC METABOLIC PANEL
Anion gap: 8 (ref 5–15)
BUN: 10 mg/dL (ref 6–23)
CHLORIDE: 103 mmol/L (ref 96–112)
CO2: 26 mmol/L (ref 19–32)
CREATININE: 0.7 mg/dL (ref 0.50–1.10)
Calcium: 9.2 mg/dL (ref 8.4–10.5)
GFR calc Af Amer: >90 mL/min (ref >90)
GFR calc non Af Amer: >90 mL/min (ref >90)
Glucose, Bld: 85 mg/dL (ref 70–99)
Potassium: 3.7 mmol/L (ref 3.5–5.1)
Sodium: 137 mmol/L (ref 135–145)

## 2014-07-21 LAB — CBC
HEMATOCRIT: 34 % — AB (ref 36.0–46.0)
Hemoglobin: 11.2 g/dL — ABNORMAL LOW (ref 12.0–15.0)
MCH: 28.7 pg (ref 26.0–34.0)
MCHC: 32.9 g/dL (ref 30.0–36.0)
MCV: 87.2 fL (ref 78.0–100.0)
Platelets: 287 10*3/uL (ref 150–400)
RBC: 3.9 MIL/uL (ref 3.87–5.11)
RDW: 13.1 % (ref 11.5–15.5)
WBC: 4.4 10*3/uL (ref 4.0–10.5)

## 2014-07-21 LAB — TROPONIN I

## 2014-07-21 LAB — URINALYSIS, ROUTINE W REFLEX MICROSCOPIC
BILIRUBIN URINE: NEGATIVE
GLUCOSE, UA: NEGATIVE mg/dL
KETONES UR: NEGATIVE mg/dL
Leukocytes, UA: NEGATIVE
NITRITE: NEGATIVE
PH: 7 (ref 5.0–8.0)
PROTEIN: NEGATIVE mg/dL
SPECIFIC GRAVITY, URINE: 1.009 (ref 1.005–1.030)
Urobilinogen, UA: 0.2 mg/dL (ref 0.0–1.0)

## 2014-07-21 LAB — URINE MICROSCOPIC-ADD ON

## 2014-07-21 LAB — PREGNANCY, URINE: Preg Test, Ur: NEGATIVE

## 2014-07-21 MED ORDER — KETOROLAC TROMETHAMINE 60 MG/2ML IM SOLN
60.0000 mg | Freq: Once | INTRAMUSCULAR | Status: AC
  Administered 2014-07-22: 60 mg via INTRAMUSCULAR
  Filled 2014-07-21: qty 2

## 2014-07-21 NOTE — ED Provider Notes (Signed)
CSN: [id number]     Arrival date & time 07/21/14  2058 History  This chart was scribed for Julianne Rice, MD by Chester Holstein, ED Scribe. This patient was seen in room A08C/A08C and the patient's care was started at 11:48 PM.    Chief Complaint  Patient presents with  . Dizziness  . Chest Pain    Patient is a 39 y.o. female presenting with dizziness and chest pain. The history is provided by the patient. No language interpreter was used.  Dizziness Associated symptoms: chest pain   Associated symptoms: no blood in stool, no diarrhea, no headaches, no nausea, no palpitations, no shortness of breath, no vomiting and no weakness   Chest Pain Associated symptoms: cough and dizziness   Associated symptoms: no abdominal pain, no back pain, no fatigue, no fever, no headache, no nausea, no numbness, no palpitations, no shortness of breath, not vomiting and no weakness     HPI Comments: Emily Erickson is a 39 y.o. female with h/o breast surgery, anxiety, and iron deficiency anemia who presents to the Emergency Department complaining of worsening left sided chest pain with onset this evening. Pt denies engaging in strenuous activity or recent trauma. Pt denies any heavy recent lifting. Pt notes pain worsens with deep inspiration, movement, and palpation.  Patient also complains of mild episodic lightheadedness with onset this morning. She notes no changes in lightheadedness with movement, stating she was sitting at onset. Currently denies any dizziness.  Pt is current menstruating. Pt denies SOB, hematochezia, dysuria, frequency, heavy menstrual periods, new medications, recent prolonged travel, and recent surgeries.   Past Medical History  Diagnosis Date  . Anxiety   . Depression   . Iron deficiency anemia    Past Surgical History  Procedure Laterality Date  . Breast surgery    . Myomectomy  2009  . Ovarian cyst surgery  2010  . Bowel resection  2010   Family History  Problem Relation Age  of Onset  . Hypertension Father    History  Substance Use Topics  . Smoking status: Current Some Day Smoker  . Smokeless tobacco: Not on file  . Alcohol Use: 0.0 oz/week    3-5 drink(s) per week   OB History    No data available     Review of Systems  Constitutional: Negative for fever, chills and fatigue.  Respiratory: Positive for cough. Negative for shortness of breath.   Cardiovascular: Positive for chest pain. Negative for palpitations and leg swelling.  Gastrointestinal: Negative for nausea, vomiting, abdominal pain, diarrhea and blood in stool.  Genitourinary: Negative for dysuria, frequency and menstrual problem.  Musculoskeletal: Positive for myalgias. Negative for back pain, neck pain and neck stiffness.  Skin: Negative for rash and wound.  Neurological: Positive for dizziness and light-headedness. Negative for weakness, numbness and headaches.  All other systems reviewed and are negative.     Allergies  Review of patient's allergies indicates no known allergies.  Home Medications   Prior to Admission medications   Medication Sig Start Date End Date Taking? Authorizing Provider  ALPRAZolam Duanne Moron) 0.25 MG tablet Take 0.25 mg by mouth at bedtime as needed for anxiety or sleep.    Yes Historical Provider, MD  fluticasone (FLONASE) 50 MCG/ACT nasal spray Place 2 sprays into both nostrils daily. 10/14/13  Yes Thao P Le, DO  cyclobenzaprine (FLEXERIL) 5 MG tablet Take 1 tablet (5 mg total) by mouth at bedtime as needed. Patient not taking: Reported on 07/21/2014 08/05/13  Thao P Le, DO  diclofenac (FLECTOR) 1.3 % PT[facility] Place 1 patch onto the skin 2 (two) times daily. Patient not taking: Reported on 07/21/2014 07/26/13   Thao P Le, DO  HYDROcodone-acetaminophen (NORCO) 5-325 MG per tablet Take 1 tablet by mouth at bedtime as needed for moderate pain. Patient not taking: Reported on 07/21/2014 09/27/13   Thao P Le, DO  ibuprofen (ADVIL,MOTRIN) 600 MG tablet Take 1 tablet (600 mg  total) by mouth 3 (three) times daily after meals. 07/22/14   Julianne Rice, MD   BP 134/83 mmHg  Pulse 72  Temp(Src) 98.1 F (36.7 C) (Oral)  Resp 19  Ht 5' 3.5" (1.613 m)  Wt 158 lb (71.668 kg)  BMI 27.55 kg/m2  SpO2 99%  LMP 07/21/2014 Physical Exam  Constitutional: She is oriented to person, place, and time. She appears well-developed and well-nourished. No distress.  HENT:  Head: Normocephalic and atraumatic.  Mouth/Throat: Oropharynx is clear and moist.  Eyes: Conjunctivae and EOM are normal. Pupils are equal, round, and reactive to light.  No nystagmus  Neck: Normal range of motion. Neck supple.  Cardiovascular: Normal rate and regular rhythm.  Exam reveals no gallop and no friction rub.   No murmur heard. Pulmonary/Chest: Effort normal and breath sounds normal. No respiratory distress. She has no wheezes. She has no rales. She exhibits tenderness (pain is reproduced with palpation over the left sternal border. There is no crepitus or deformity. No obvious trauma.).  Abdominal: Soft. Bowel sounds are normal. She exhibits no distension and no mass. There is no tenderness. There is no rebound and no guarding.  Musculoskeletal: Normal range of motion. She exhibits no edema or tenderness.  No calf swelling or tenderness.  Neurological: She is alert and oriented to person, place, and time.  Patient is alert and oriented x3 with clear, goal oriented speech. Patient has 5/5 motor in all extremities. Sensation is intact to light touch. Bilateral finger-to-nose is normal with no signs of dysmetria. Patient has a normal gait and walks without assistance.  Skin: Skin is warm and dry. No rash noted. No erythema.  Psychiatric: She has a normal mood and affect. Her behavior is normal.  Nursing note and vitals reviewed.   ED Course  Procedures (including critical care time) DIAGNOSTIC STUDIES: Oxygen Saturation is 99% on room air, normal by my interpretation.    COORDINATION OF  CARE: 1:24 AM Discussed treatment plan with patient at beside, the patient agrees with the plan and has no further questions at this time.   Labs Review Labs Reviewed  CBC - Abnormal; Notable for the following:    Hemoglobin 11.2 (*)    HCT 34.0 (*)    All other components within normal limits  URINALYSIS, ROUTINE W REFLEX MICROSCOPIC - Abnormal; Notable for the following:    Hgb urine dipstick TRACE (*)    All other components within normal limits  BASIC METABOLIC PANEL  TROPONIN I  PREGNANCY, URINE  URINE MICROSCOPIC-ADD ON    Imaging Review Dg Chest 2 View  07/21/2014   CLINICAL DATA:  39 year old female with dizziness scan central chest pain. Shortness of Breath. Initial encounter.  EXAM: CHEST  2 VIEW  COMPARISON:  07/02/2012  FINDINGS: Stable and normal lung volumes. Cardiac size at the upper limits of normal. Other mediastinal contours are within normal limits. Visualized tracheal air column is within normal limits. The lungs are clear. No pneumothorax or effusion. No acute osseous abnormality identified.  IMPRESSION: No acute cardiopulmonary abnormality.  Electronically Signed   By: Genevie Ann M.D.   On: 07/21/2014 21:58     EKG Interpretation   Date/Time:  Friday July 21 2014 21:03:53 EST Ventricular Rate:  80 PR Interval:  150 QRS Duration: 70 QT Interval:  374 QTC Calculation: 431 R Axis:   84 Text Interpretation:  Normal sinus rhythm Possible Left atrial enlargement  Low voltage QRS Borderline ECG Confirmed by Lita Mains  MD, Rafe Mackowski ([id number])  on 07/21/2014 11:11:02 PM      MDM   Final diagnoses:  Left-sided chest wall pain  Episodic lightheadedness    I personally performed the services described in this documentation, which was scribed in my presence. The recorded information has been reviewed and is accurate.  Patient's fair well-appearing. Chest wall pain is easily reproduced with palpation of the left chest. Consistent with costochondritis versus muscle  strain. Have very low suspicion for PE.PERC negative. Chest x-ray without acute findings. No cause for patient's lightheadedness found. She has a normal neurologic exam. Patient was not orthostatic. Advised to increase fluid intake and change positions slowly. Patient also should follow up with her primary physician she's been given return precautions and voiced understanding.    Julianne Rice, MD 07/22/14 [phone]

## 2014-07-21 NOTE — ED Notes (Signed)
The pt is c/o dizzihess and chest pain today with nausea. Sob when climbing stairs for one week.  lmp now

## 2014-07-22 MED ORDER — IBUPROFEN 600 MG PO TABS: 600.0000 mg | ORAL_TABLET | Freq: Three times a day (TID) | ORAL | Status: AC

## 2014-07-22 NOTE — Discharge Instructions (Signed)
Make sure drinking plenty of water. Change positions slowly. Take medications as prescribed. Call and make an appointment to follow-up with her primary doctor.  Chest Wall Pain Chest wall pain is pain in or around the bones and muscles of your chest. It may take up to 6 weeks to get better. It may take longer if you must stay physically active in your work and activities.  CAUSES  Chest wall pain may happen on its own. However, it may be caused by:  A viral illness like the flu.  Injury.  Coughing.  Exercise.  Arthritis.  Fibromyalgia.  Shingles. HOME CARE INSTRUCTIONS   Avoid overtiring physical activity. Try not to strain or perform activities that cause pain. This includes any activities using your chest or your abdominal and side muscles, especially if heavy weights are used.  Put ice on the sore area.  Put ice in a plastic bag.  Place a towel between your skin and the bag.  Leave the ice on for 15-20 minutes per hour while awake for the first 2 days.  Only take over-the-counter or prescription medicines for pain, discomfort, or fever as directed by your caregiver. SEEK IMMEDIATE MEDICAL CARE IF:   Your pain increases, or you are very uncomfortable.  You have a fever.  Your chest pain becomes worse.  You have new, unexplained symptoms.  You have nausea or vomiting.  You feel sweaty or lightheaded.  You have a cough with phlegm (sputum), or you cough up blood. MAKE SURE YOU:   Understand these instructions.  Will watch your condition.  Will get help right away if you are not doing well or get worse. Document Released: 05/26/2005 Document Revised: 08/18/2011 Document Reviewed: 01/20/2011 [facility] Patient Information 2015 [place], [place]. This information is not intended to replace advice given to you by your health care provider. Make sure you discuss any questions you have with your health care provider.  Dizziness Dizziness is a common problem. It is a  feeling of unsteadiness or light-headedness. You may feel like you are about to faint. Dizziness can lead to injury if you stumble or fall. A person of any age group can suffer from dizziness, but dizziness is more common in older adults. CAUSES  Dizziness can be caused by many different things, including:  Middle ear problems.  Standing for too long.  Infections.  An allergic reaction.  Aging.  An emotional response to something, such as the sight of blood.  Side effects of medicines.  Tiredness.  Problems with circulation or blood pressure.  Excessive use of alcohol or medicines, or illegal drug use.  Breathing too fast (hyperventilation).  An irregular heart rhythm (arrhythmia).  A low red blood cell count (anemia).  Pregnancy.  Vomiting, diarrhea, fever, or other illnesses that cause body fluid loss (dehydration).  Diseases or conditions such as Parkinson's disease, high blood pressure (hypertension), diabetes, and thyroid problems.  Exposure to extreme heat. DIAGNOSIS  Your health care provider will ask about your symptoms, perform a physical exam, and perform an electrocardiogram (ECG) to record the electrical activity of your heart. Your health care provider may also perform other heart or blood tests to determine the cause of your dizziness. These may include:  Transthoracic echocardiogram (TTE). During echocardiography, sound waves are used to evaluate how blood flows through your heart.  Transesophageal echocardiogram (TEE).  Cardiac monitoring. This allows your health care provider to monitor your heart rate and rhythm in real time.  Holter monitor. This is a portable  device that records your heartbeat and can help diagnose heart arrhythmias. It allows your health care provider to track your heart activity for several days if needed.  Stress tests by exercise or by giving medicine that makes the heart beat faster. TREATMENT  Treatment of dizziness depends  on the cause of your symptoms and can vary greatly. HOME CARE INSTRUCTIONS   Drink enough fluids to keep your urine clear or pale yellow. This is especially important in very hot weather. In older adults, it is also important in cold weather.  Take your medicine exactly as directed if your dizziness is caused by medicines. When taking blood pressure medicines, it is especially important to get up slowly.  Rise slowly from chairs and steady yourself until you feel okay.  In the morning, first sit up on the side of the bed. When you feel okay, stand slowly while holding onto something until you know your balance is fine.  Move your legs often if you need to stand in one place for a long time. Tighten and relax your muscles in your legs while standing.  Have someone stay with you for 1-2 days if dizziness continues to be a problem. Do this until you feel you are well enough to stay alone. Have the person call your health care provider if he or she notices changes in you that are concerning.  Do not drive or use heavy machinery if you feel dizzy.  Do not drink alcohol. SEEK IMMEDIATE MEDICAL CARE IF:   Your dizziness or light-headedness gets worse.  You feel nauseous or vomit.  You have problems talking, walking, or using your arms, hands, or legs.  You feel weak.  You are not thinking clearly or you have trouble forming sentences. It may take a friend or family member to notice this.  You have chest pain, abdominal pain, shortness of breath, or sweating.  Your vision changes.  You notice any bleeding.  You have side effects from medicine that seems to be getting worse rather than better. MAKE SURE YOU:   Understand these instructions.  Will watch your condition.  Will get help right away if you are not doing well or get worse. Document Released: 11/19/2000 Document Revised: 05/31/2013 Document Reviewed: 12/13/2010 [facility] Patient Information 2015 [place], [place]. This  information is not intended to replace advice given to you by your health care provider. Make sure you discuss any questions you have with your health care provider.

## 2014-07-22 NOTE — ED Notes (Signed)
Patient is resting comfortably. 

## 2014-08-01 IMAGING — US US SOFT TISSUE HEAD/NECK
1 series · 14 of 25 positions shown · non-contrast
Comparison: 11/13/2008

CLINICAL DATA: Nodules, anterior neck pain

EXAM:
THYROID ULTRASOUND
TECHNIQUE: Ultrasound examination of the thyroid gland and adjacent soft
tissues was performed.

[Series 1: us soft tissue head/neck · 0.09mm/px · 14 of 50 slices shown]
[im 1/50]
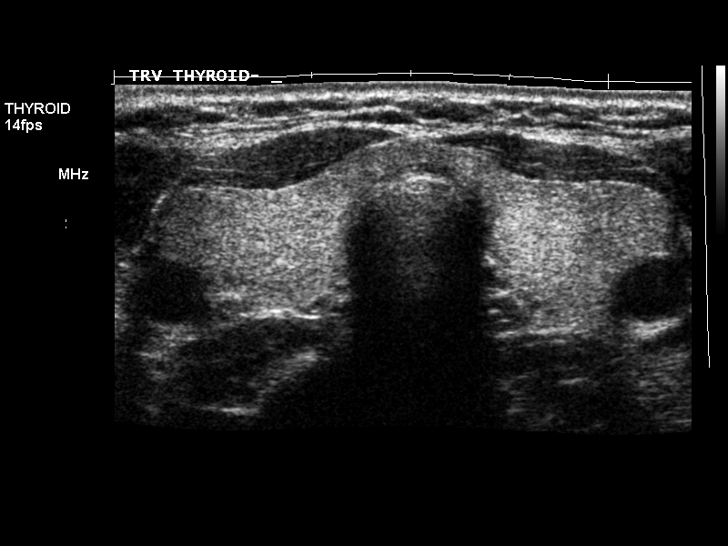
[im 5/50]
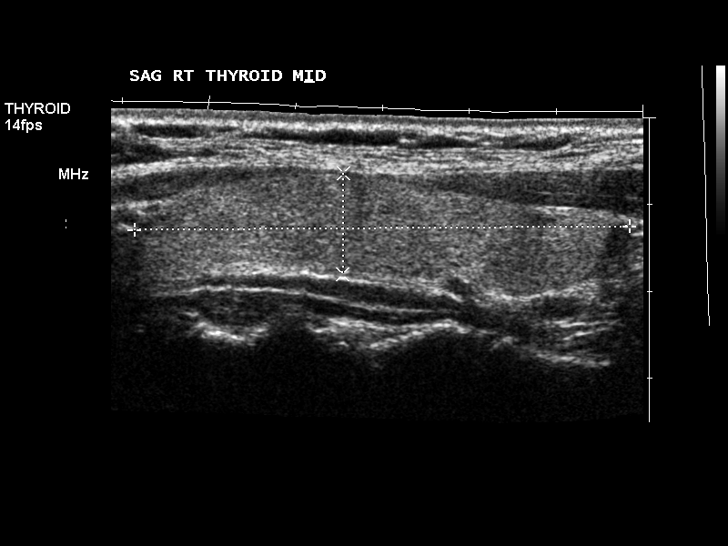
[im 9/50]
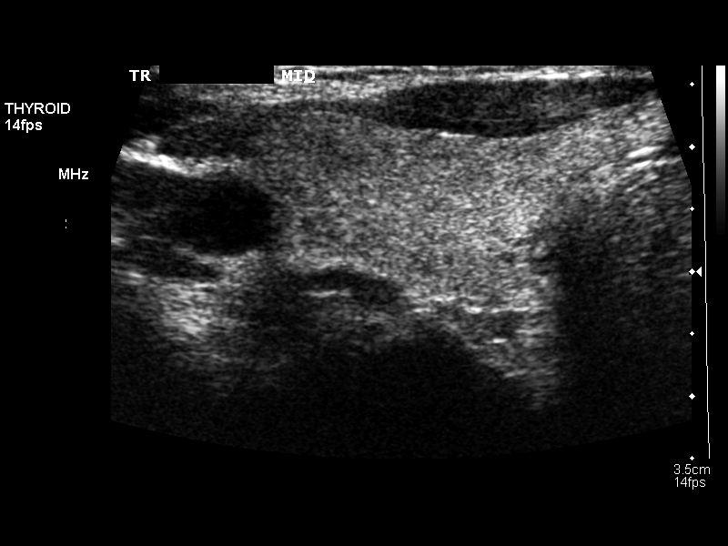
[im 13/50]
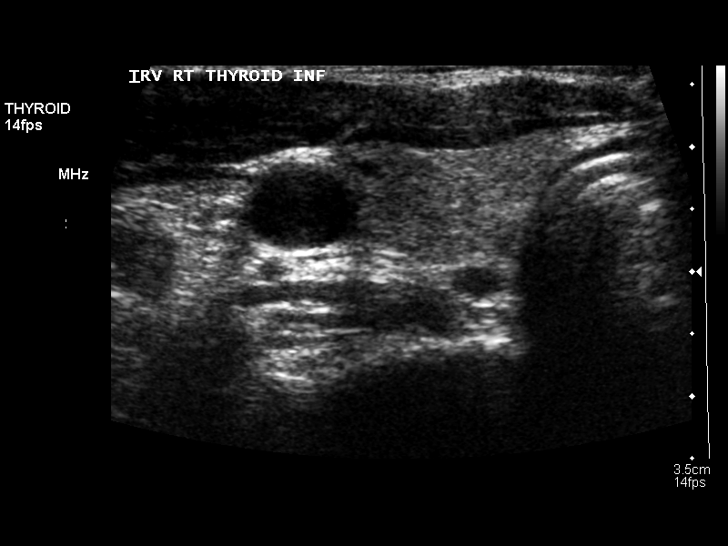
[im 17/50]
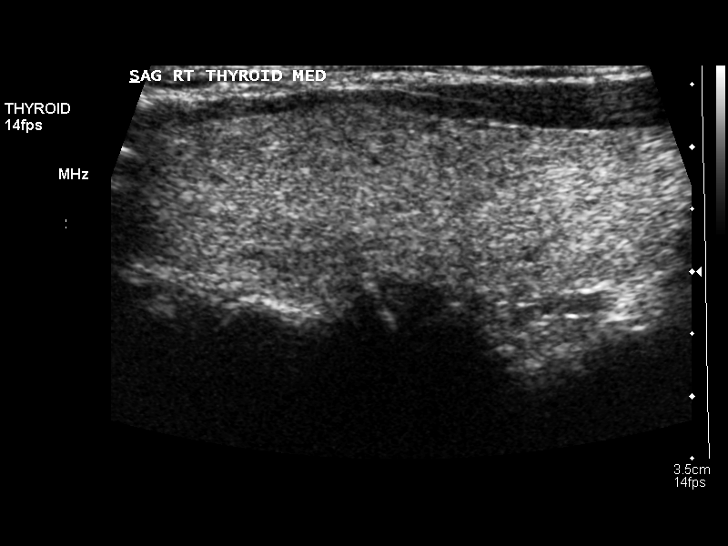
[im 19/50]
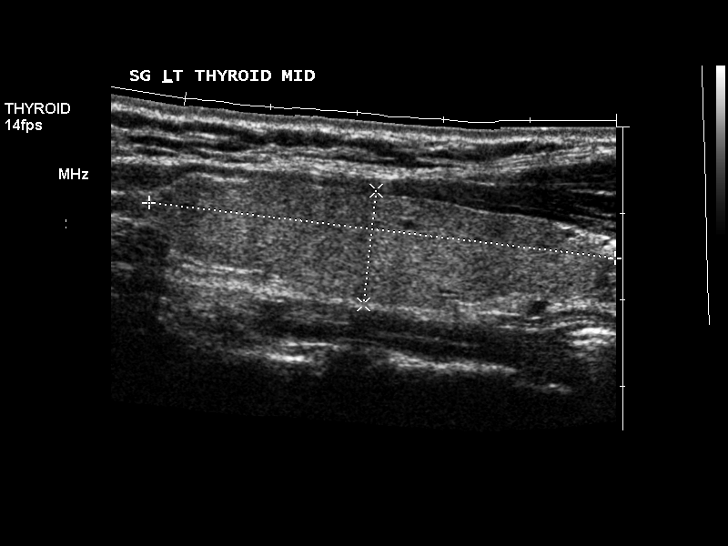
[im 23/50]
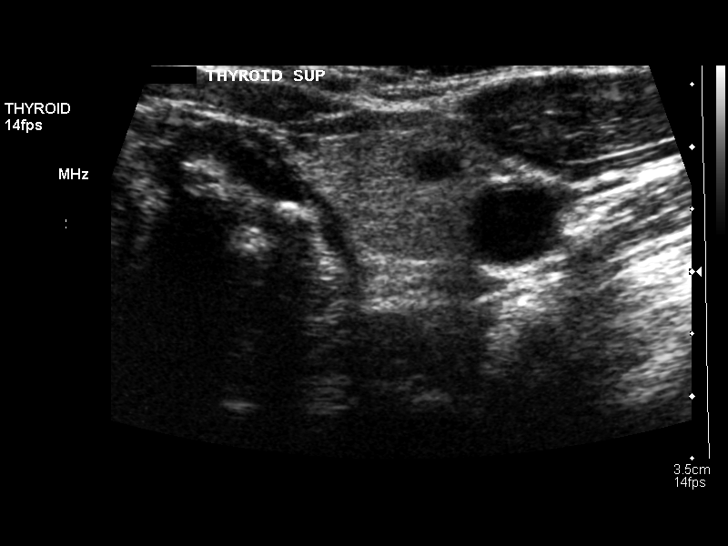
[im 27/50]
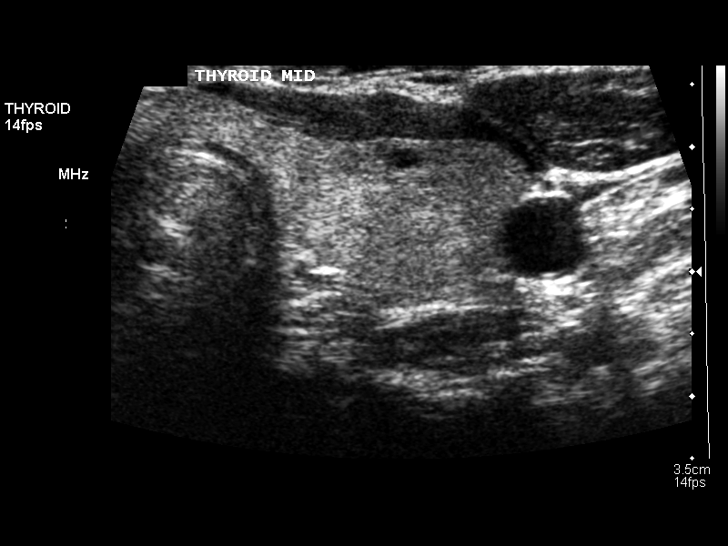
[im 31/50]
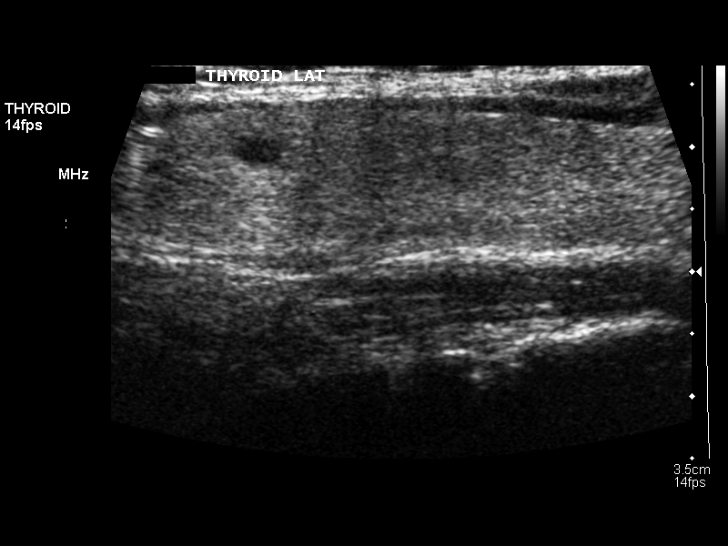
[im 33/50]
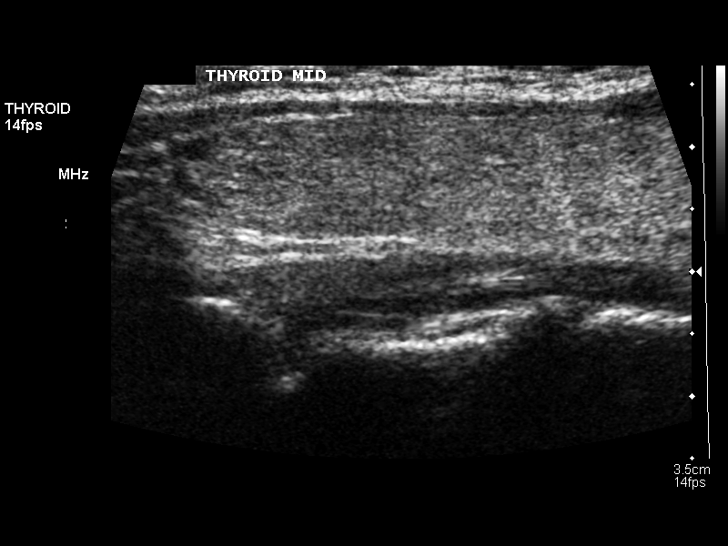
[im 37/50]
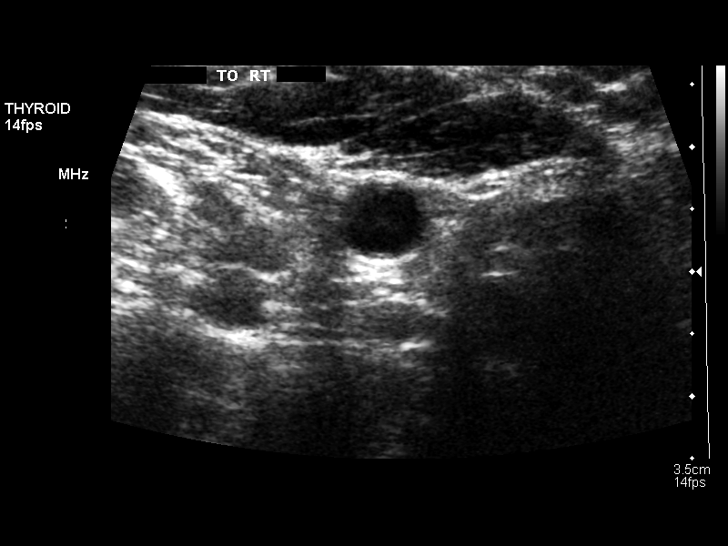
[im 41/50]
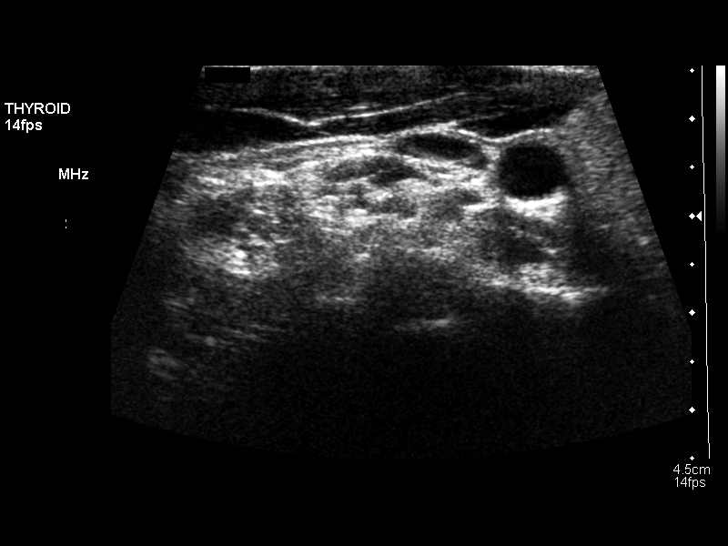
[im 45/50]
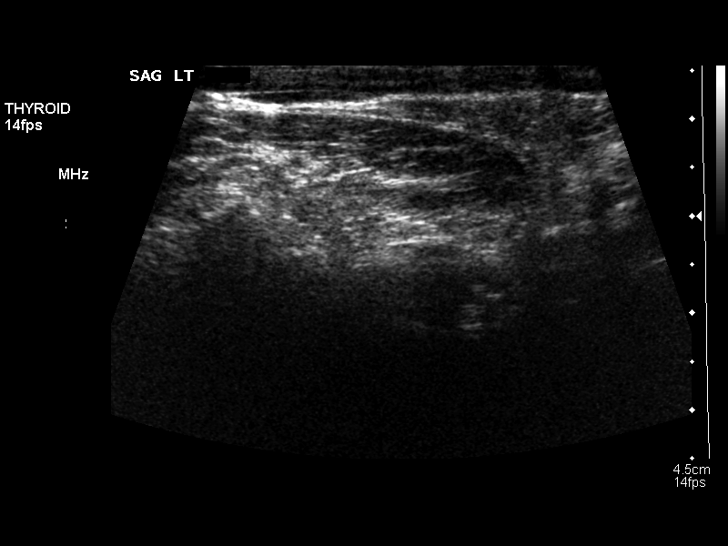
[im 50/50]
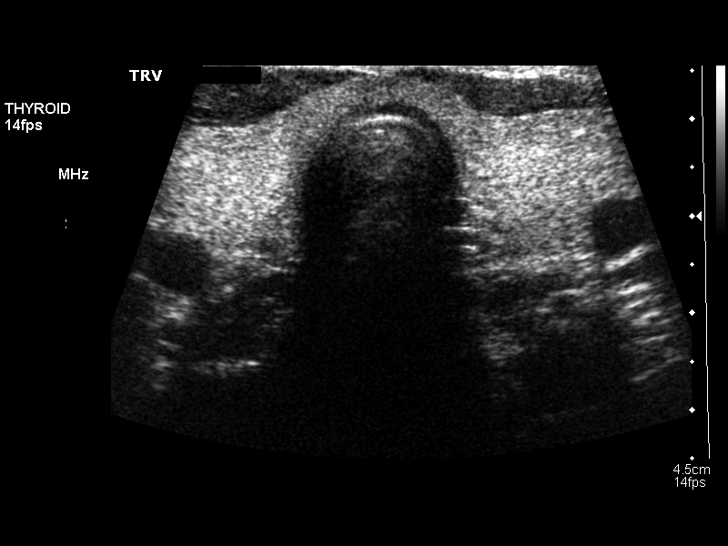

[14 of 25 positions shown; findings below may reference images not displayed]

FINDINGS: Right thyroid lobe

Measurements: 57 x 12 x 22 mm.  3 mm cyst in the inferior pole.

Left thyroid lobe

Measurements: 54 x 13 x 22 mm. 4 mm hypoechoic nodule, superior pole
and several smaller hypoechoic/cystic lesions.

Isthmus

Thickness: 3.4 mm.  No nodules visualized.

Lymphadenopathy

None visualized.
IMPRESSION: 1. Thyromegaly with several small cysts/nodules. Findings do not
meet current consensus criteria for biopsy. Follow-up by clinical
exam is recommended. If patient has known risk factors for thyroid
carcinoma, consider follow-up ultrasound in 12 months. If patient is
clinically hyperthyroid, consider nuclear medicine thyroid uptake
and scan. This recommendation follows the consensus statement:
Management of Thyroid Nodules Detected as US: Society of
Radiologists in Ultrasound Consensus Conference Statement. Radiology

## 2015-09-03 ENCOUNTER — Telehealth: Payer: Self-pay

## 2015-09-03 NOTE — Telephone Encounter (Signed)
Patient wants to know the turn around time for her request of ROI   [phone]

## 2016-01-22 ENCOUNTER — Other Ambulatory Visit: Payer: Self-pay | Admitting: Family Medicine

## 2016-01-22 DIAGNOSIS — R519 Headache, unspecified: Secondary | ICD-10-CM

## 2016-01-22 DIAGNOSIS — R51 Headache: Principal | ICD-10-CM

## 2016-01-23 ENCOUNTER — Other Ambulatory Visit: Payer: Self-pay | Admitting: Family Medicine

## 2016-01-23 DIAGNOSIS — R519 Headache, unspecified: Secondary | ICD-10-CM

## 2016-01-23 DIAGNOSIS — R51 Headache: Principal | ICD-10-CM

## 2016-01-25 ENCOUNTER — Ambulatory Visit
Admission: RE | Admit: 2016-01-25 | Discharge: 2016-01-25 | Disposition: A | Discharge status: Home / Self Care | Payer: 59 | Source: Ambulatory Visit | Attending: Family Medicine | Admitting: Family Medicine

## 2016-01-25 ENCOUNTER — Other Ambulatory Visit: Payer: BLUE CROSS/BLUE SHIELD

## 2016-01-25 DIAGNOSIS — R519 Headache, unspecified: Secondary | ICD-10-CM

## 2016-01-25 DIAGNOSIS — R51 Headache: Principal | ICD-10-CM

## 2016-01-30 ENCOUNTER — Other Ambulatory Visit: Payer: BLUE CROSS/BLUE SHIELD

## 2016-01-31 ENCOUNTER — Other Ambulatory Visit: Payer: BLUE CROSS/BLUE SHIELD

## 2016-06-17 DIAGNOSIS — N951 Menopausal and female climacteric states: Secondary | ICD-10-CM | POA: Diagnosis not present

## 2016-06-17 DIAGNOSIS — Z09 Encounter for follow-up examination after completed treatment for conditions other than malignant neoplasm: Secondary | ICD-10-CM | POA: Diagnosis not present

## 2016-08-06 DIAGNOSIS — N951 Menopausal and female climacteric states: Secondary | ICD-10-CM | POA: Diagnosis not present

## 2016-08-06 DIAGNOSIS — M79642 Pain in left hand: Secondary | ICD-10-CM | POA: Diagnosis not present

## 2016-08-06 DIAGNOSIS — M62830 Muscle spasm of back: Secondary | ICD-10-CM | POA: Diagnosis not present

## 2016-08-18 DIAGNOSIS — M792 Neuralgia and neuritis, unspecified: Secondary | ICD-10-CM | POA: Diagnosis not present

## 2016-08-18 DIAGNOSIS — M542 Cervicalgia: Secondary | ICD-10-CM | POA: Diagnosis not present

## 2016-08-20 DIAGNOSIS — M792 Neuralgia and neuritis, unspecified: Secondary | ICD-10-CM | POA: Diagnosis not present

## 2016-09-01 DIAGNOSIS — Z0100 Encounter for examination of eyes and vision without abnormal findings: Secondary | ICD-10-CM | POA: Diagnosis not present

## 2016-09-03 DIAGNOSIS — M792 Neuralgia and neuritis, unspecified: Secondary | ICD-10-CM | POA: Diagnosis not present

## 2016-09-10 DIAGNOSIS — M792 Neuralgia and neuritis, unspecified: Secondary | ICD-10-CM | POA: Diagnosis not present

## 2016-09-15 DIAGNOSIS — M62830 Muscle spasm of back: Secondary | ICD-10-CM | POA: Diagnosis not present

## 2016-09-15 DIAGNOSIS — I10 Essential (primary) hypertension: Secondary | ICD-10-CM | POA: Diagnosis not present

## 2016-09-15 DIAGNOSIS — M792 Neuralgia and neuritis, unspecified: Secondary | ICD-10-CM | POA: Diagnosis not present

## 2016-09-15 DIAGNOSIS — Z0000 Encounter for general adult medical examination without abnormal findings: Secondary | ICD-10-CM | POA: Diagnosis not present

## 2016-11-27 IMAGING — CT CT HEAD W/O CM
3 of 4 series · 16 of 47 positions shown, 19 images · non-contrast
Comparison: None.

CLINICAL DATA: Two week history of headache and eye pain. Vertigo
with lightheadedness.

EXAM:
CT HEAD WITHOUT CONTRAST
TECHNIQUE: Contiguous axial images were obtained from the base of the skull
through the vertex without intravenous contrast.

[Series 32: 3d filtered head w/o · axial · non-contrast · 0.49mm/px · z∈[-22,+108]mm · 10 of 32 slices shown, 13 images]
[im 3/32  brain]
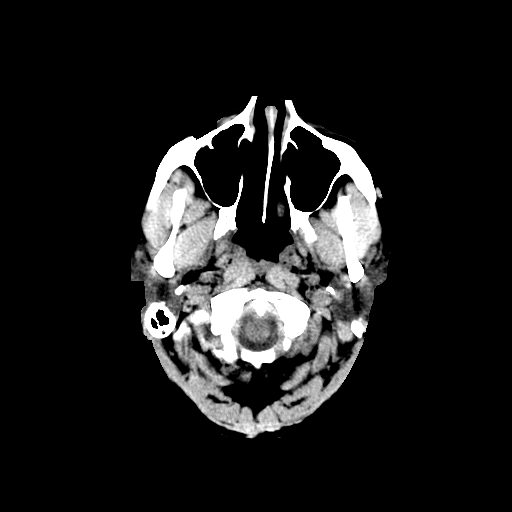
[im 3/32  bone]
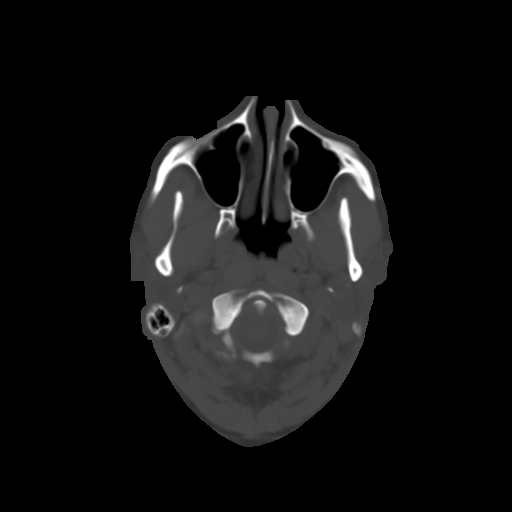
[im 5/32  brain]
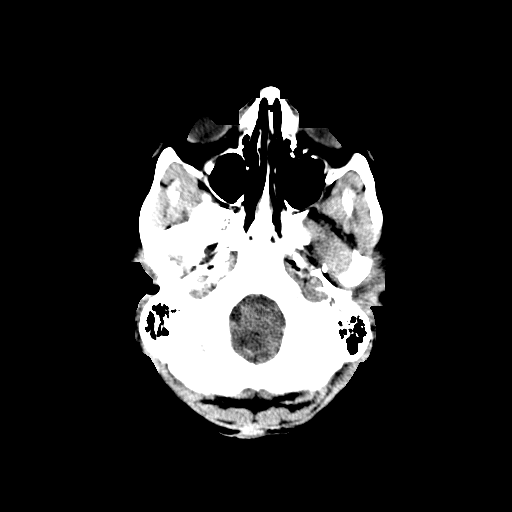
[im 9/32  brain]
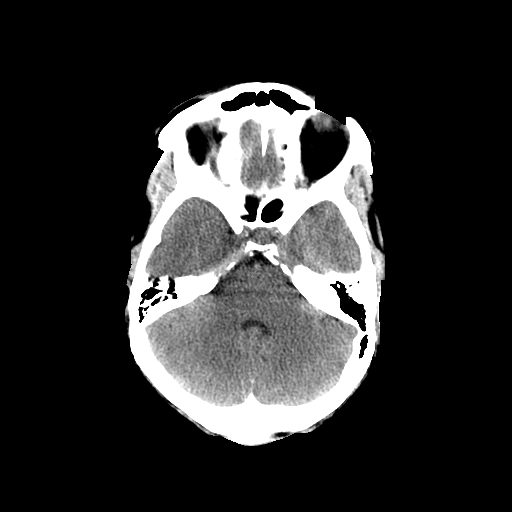
[im 12/32  brain]
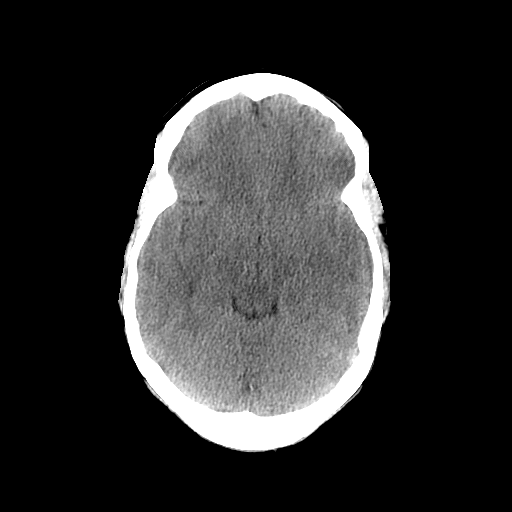
[im 14/32  brain]
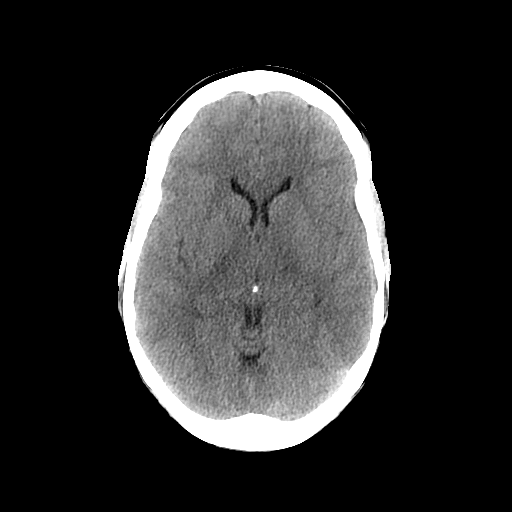
[im 14/32  bone]
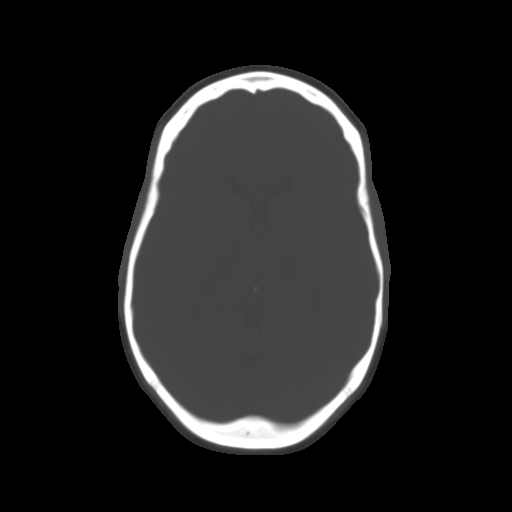
[im 18/32  brain]
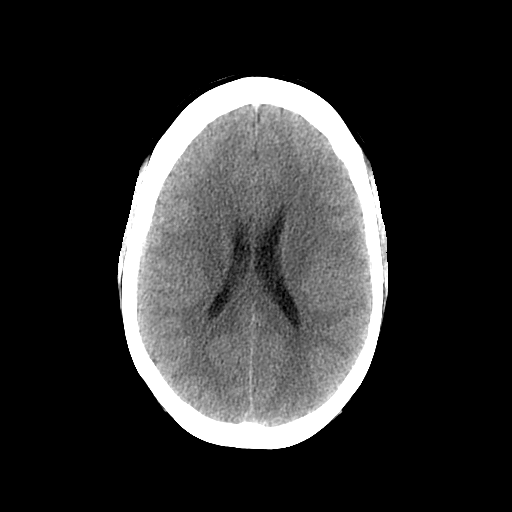
[im 20/32  brain]
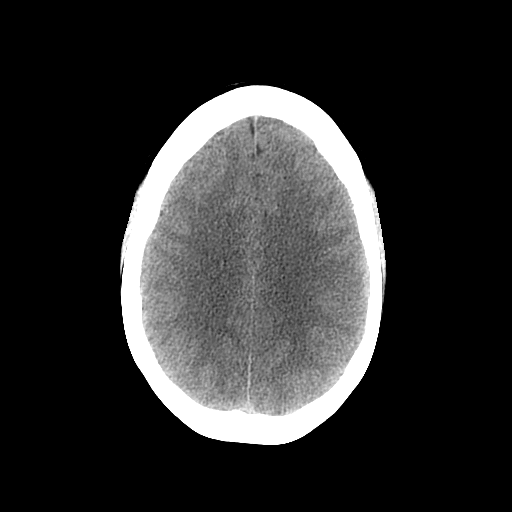
[im 23/32  brain]
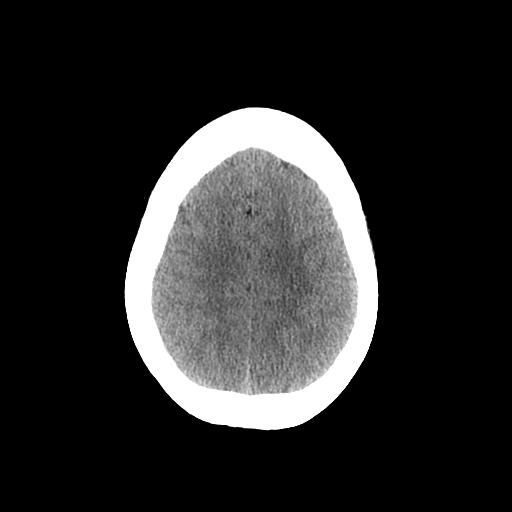
[im 27/32  brain]
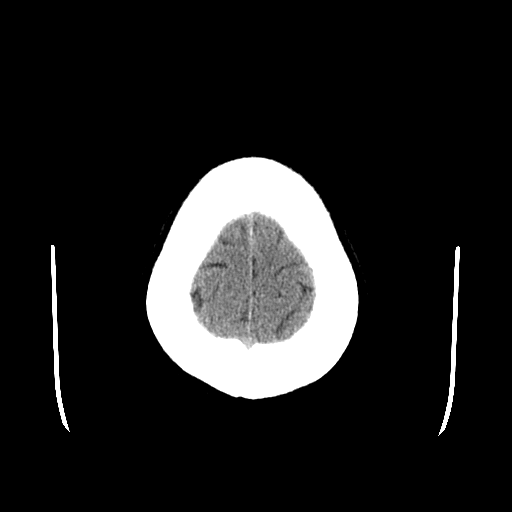
[im 27/32  bone]
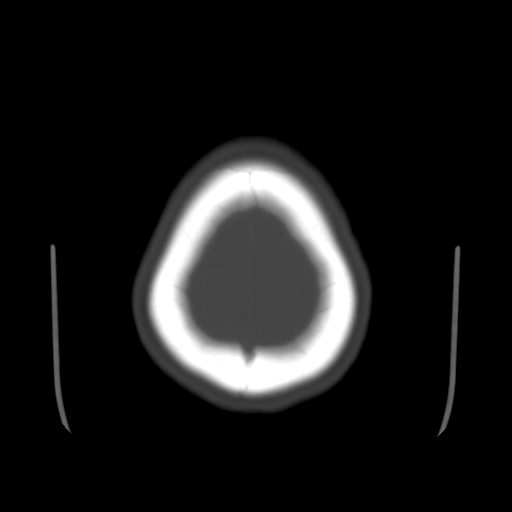
[im 29/32  brain]
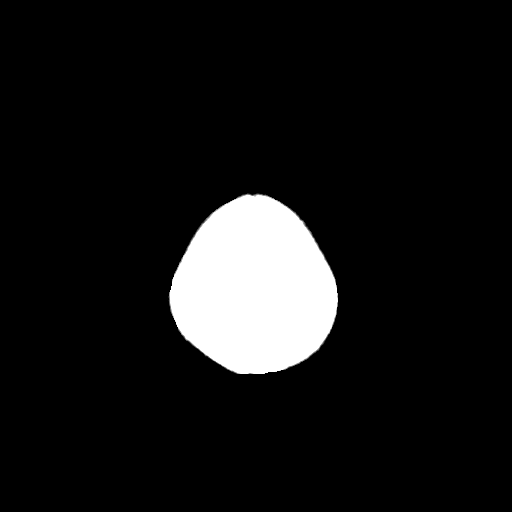

[Series 601: coronal brain · coronal · 0.49mm/px · 3 of 68 slices shown]
[im 23/68  brain]
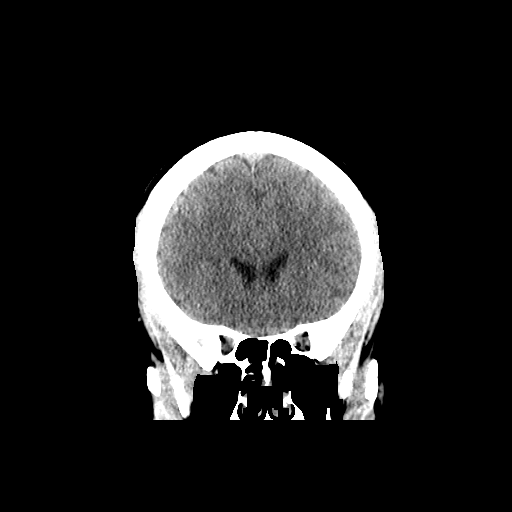
[im 30/68  brain]
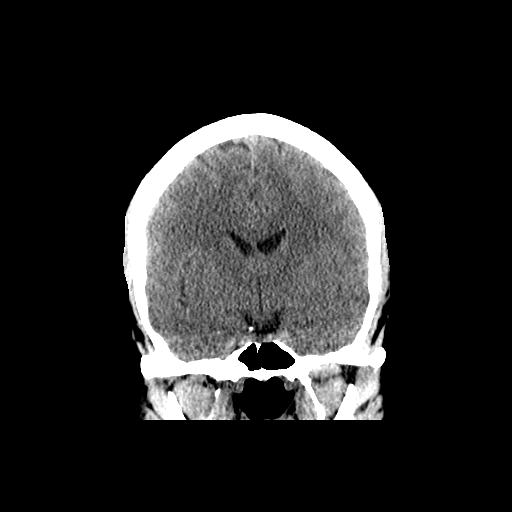
[im 38/68  brain]
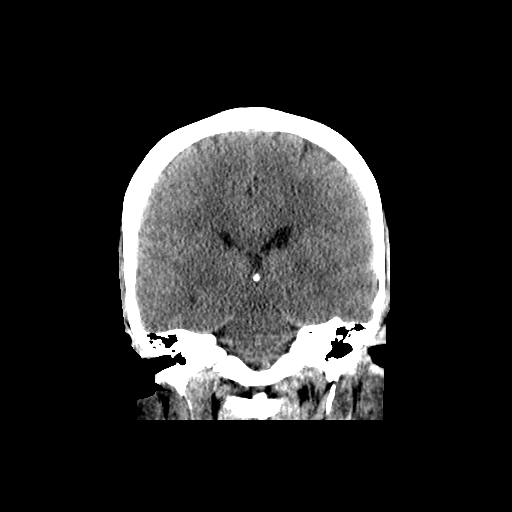

[Series 602: sagittal brain · sagittal · 0.49mm/px · 3 of 53 slices shown]
[im 18/53  brain]
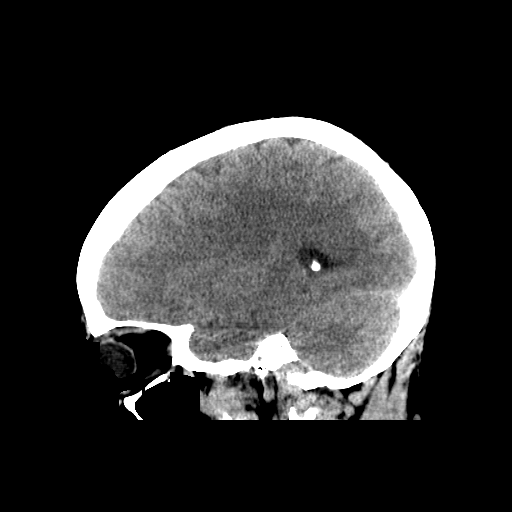
[im 27/53  brain]
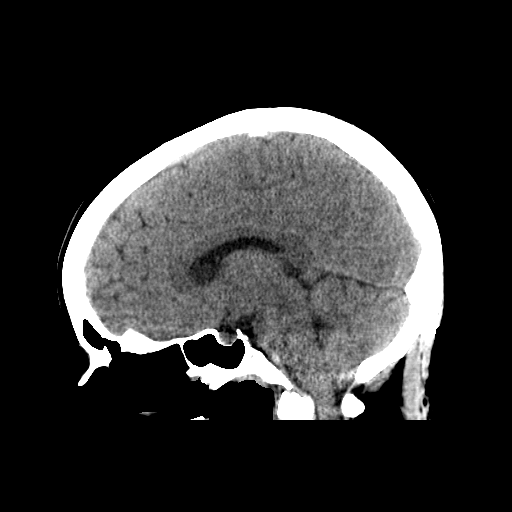
[im 35/53  brain]
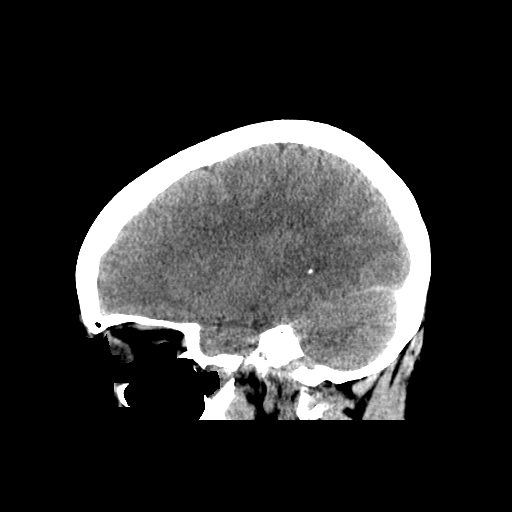

[16 of 47 positions shown; findings below may reference images not displayed]

FINDINGS: Brain: There is no evidence for acute hemorrhage, hydrocephalus,
mass lesion, or abnormal extra-axial fluid collection. No definite
CT evidence for acute infarction.

Vascular: No evidence for dense MCA sign. The major dural sinuses
are unremarkable.

Skull: No skull fracture.

Sinuses/Orbits: The visualized paranasal sinuses and mastoid air
cells are clear. Visualized intraorbital fat is preserved
bilaterally.

Other: Unremarkable
IMPRESSION: No acute intracranial abnormality.

## 2017-01-22 DIAGNOSIS — M9905 Segmental and somatic dysfunction of pelvic region: Secondary | ICD-10-CM | POA: Diagnosis not present

## 2017-01-22 DIAGNOSIS — M5386 Other specified dorsopathies, lumbar region: Secondary | ICD-10-CM | POA: Diagnosis not present

## 2017-01-22 DIAGNOSIS — M9903 Segmental and somatic dysfunction of lumbar region: Secondary | ICD-10-CM | POA: Diagnosis not present

## 2017-01-26 DIAGNOSIS — M9905 Segmental and somatic dysfunction of pelvic region: Secondary | ICD-10-CM | POA: Diagnosis not present

## 2017-01-26 DIAGNOSIS — M9903 Segmental and somatic dysfunction of lumbar region: Secondary | ICD-10-CM | POA: Diagnosis not present

## 2017-01-26 DIAGNOSIS — M5386 Other specified dorsopathies, lumbar region: Secondary | ICD-10-CM | POA: Diagnosis not present

## 2017-01-27 DIAGNOSIS — M5386 Other specified dorsopathies, lumbar region: Secondary | ICD-10-CM | POA: Diagnosis not present

## 2017-01-27 DIAGNOSIS — M9905 Segmental and somatic dysfunction of pelvic region: Secondary | ICD-10-CM | POA: Diagnosis not present

## 2017-01-27 DIAGNOSIS — M9903 Segmental and somatic dysfunction of lumbar region: Secondary | ICD-10-CM | POA: Diagnosis not present

## 2017-01-29 DIAGNOSIS — M9905 Segmental and somatic dysfunction of pelvic region: Secondary | ICD-10-CM | POA: Diagnosis not present

## 2017-01-29 DIAGNOSIS — M9903 Segmental and somatic dysfunction of lumbar region: Secondary | ICD-10-CM | POA: Diagnosis not present

## 2017-01-29 DIAGNOSIS — M5386 Other specified dorsopathies, lumbar region: Secondary | ICD-10-CM | POA: Diagnosis not present

## 2017-02-04 DIAGNOSIS — M9905 Segmental and somatic dysfunction of pelvic region: Secondary | ICD-10-CM | POA: Diagnosis not present

## 2017-02-04 DIAGNOSIS — M5386 Other specified dorsopathies, lumbar region: Secondary | ICD-10-CM | POA: Diagnosis not present

## 2017-02-04 DIAGNOSIS — M9903 Segmental and somatic dysfunction of lumbar region: Secondary | ICD-10-CM | POA: Diagnosis not present

## 2017-02-05 DIAGNOSIS — M5386 Other specified dorsopathies, lumbar region: Secondary | ICD-10-CM | POA: Diagnosis not present

## 2017-02-05 DIAGNOSIS — M9905 Segmental and somatic dysfunction of pelvic region: Secondary | ICD-10-CM | POA: Diagnosis not present

## 2017-02-05 DIAGNOSIS — M9903 Segmental and somatic dysfunction of lumbar region: Secondary | ICD-10-CM | POA: Diagnosis not present

## 2018-05-18 DIAGNOSIS — I10 Essential (primary) hypertension: Secondary | ICD-10-CM | POA: Diagnosis not present

## 2018-05-18 DIAGNOSIS — M549 Dorsalgia, unspecified: Secondary | ICD-10-CM | POA: Diagnosis not present

## 2018-07-07 DIAGNOSIS — Z79890 Hormone replacement therapy (postmenopausal): Secondary | ICD-10-CM | POA: Diagnosis not present

## 2019-12-01 ENCOUNTER — Other Ambulatory Visit: Payer: Self-pay | Admitting: Family Medicine

## 2019-12-01 DIAGNOSIS — R1011 Right upper quadrant pain: Secondary | ICD-10-CM

## 2020-09-17 ENCOUNTER — Ambulatory Visit
Admission: RE | Admit: 2020-09-17 | Discharge: 2020-09-17 | Disposition: A | Discharge status: Home / Self Care | Payer: 59 | Source: Ambulatory Visit | Attending: Family Medicine | Admitting: Family Medicine

## 2020-09-17 DIAGNOSIS — R1011 Right upper quadrant pain: Secondary | ICD-10-CM

## 2021-07-21 IMAGING — US US ABDOMEN LIMITED RUQ/ASCITES
1 series · 14 of 25 positions shown · non-contrast
Comparison: None.

CLINICAL DATA: Right upper quadrant pain for 2 months

EXAM:
ULTRASOUND ABDOMEN LIMITED RIGHT UPPER QUADRANT

[Series 1: us abdomen limited ruq/ascites · 0.23mm/px · 14 of 43 slices shown]
[im 1/43]
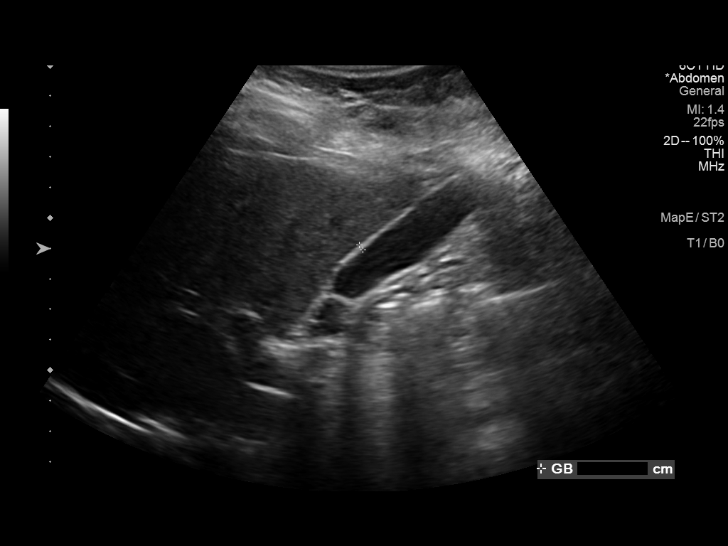
[im 4/43]
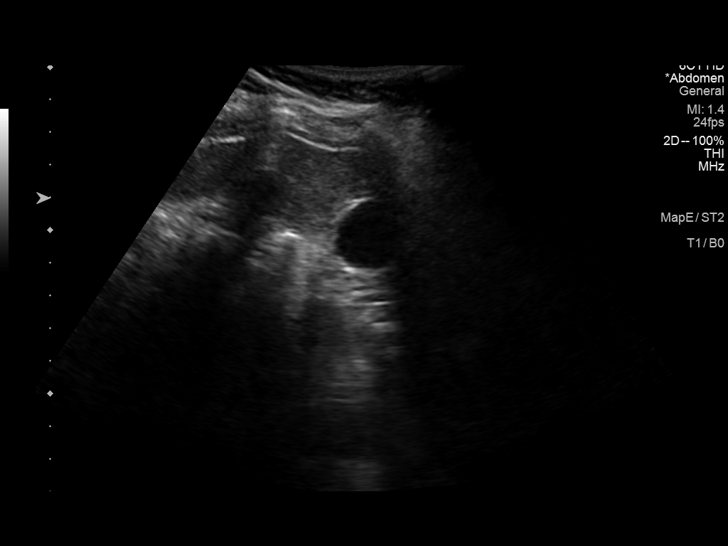
[im 8/43]
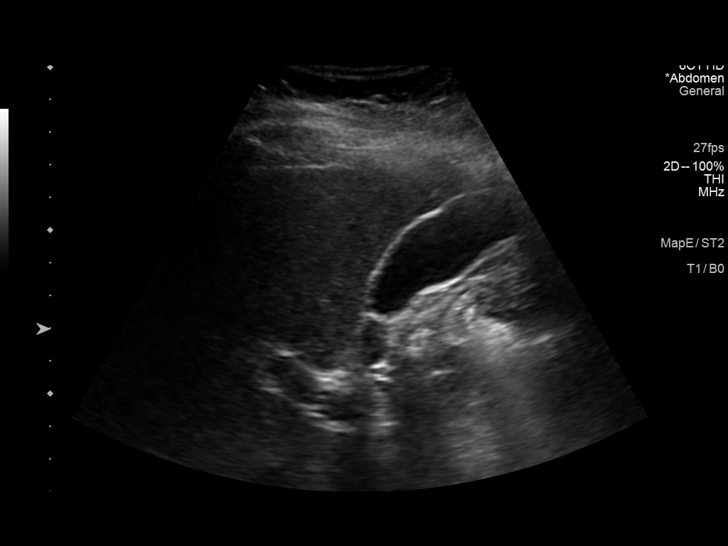
[im 11/43]
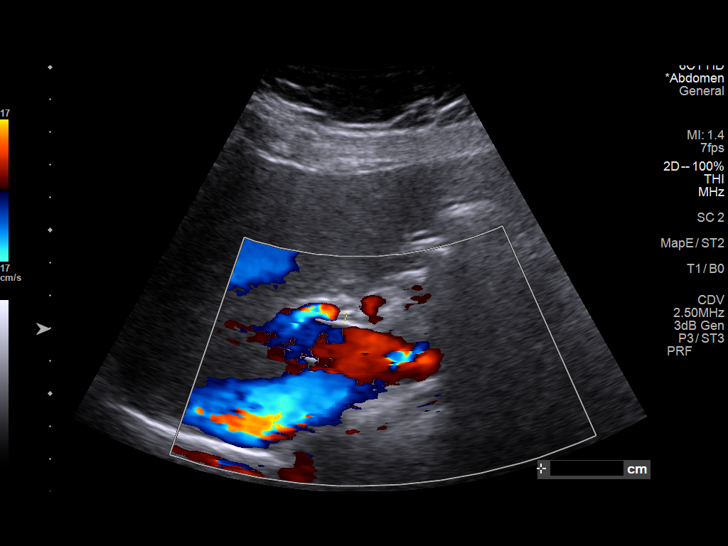
[im 15/43]
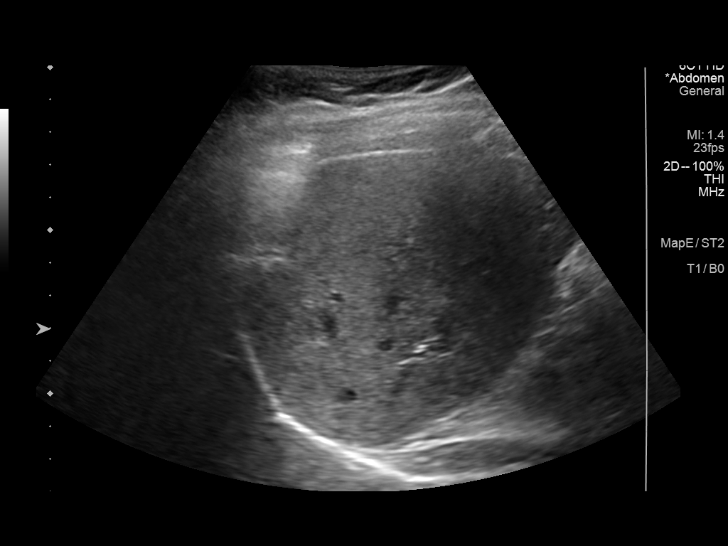
[im 16/43]
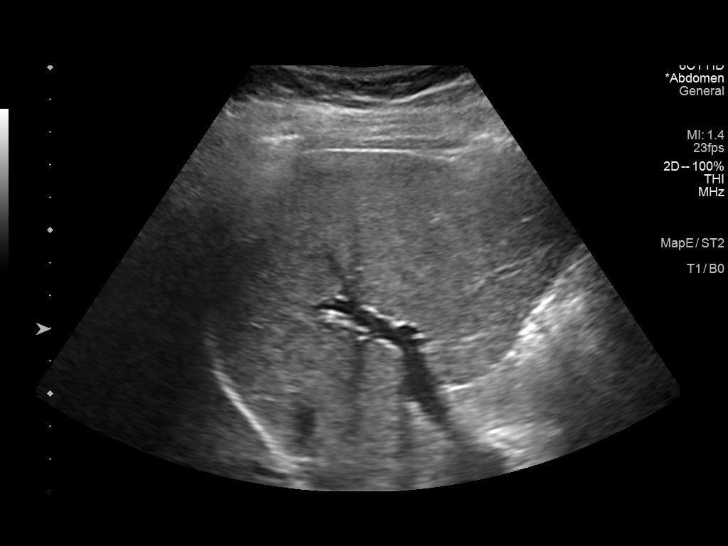
[im 20/43]
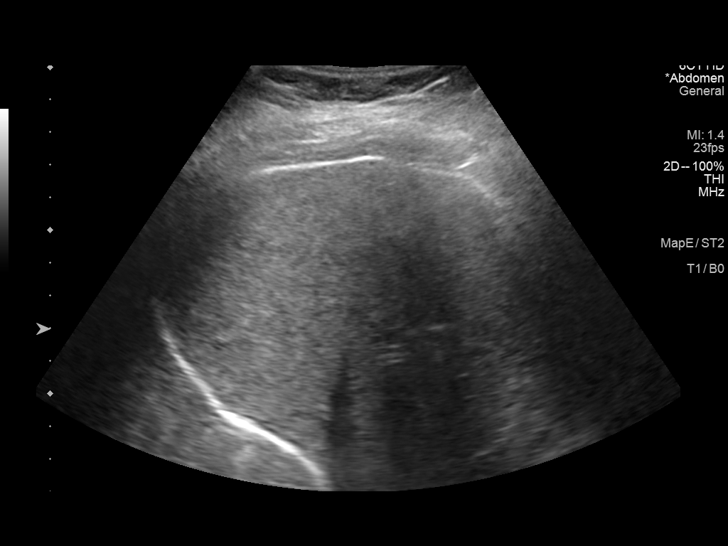
[im 23/43]
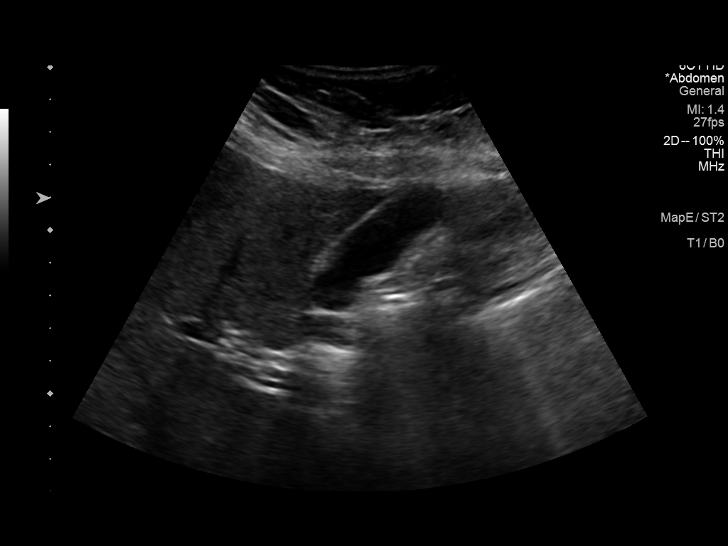
[im 27/43]
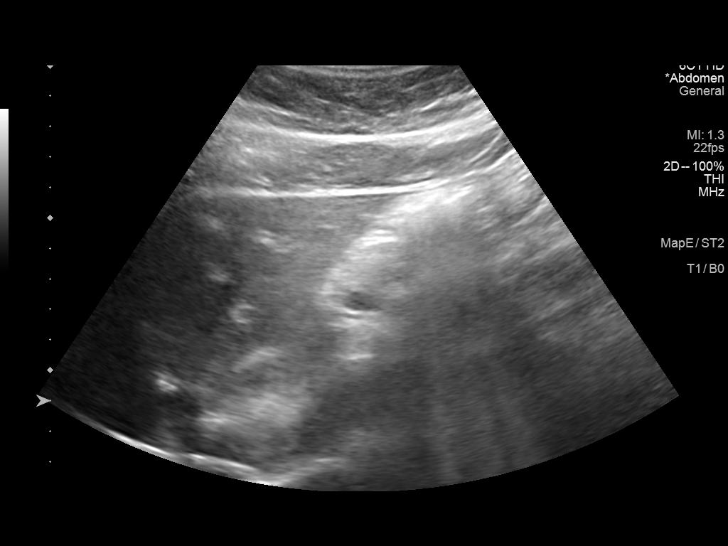
[im 29/43]
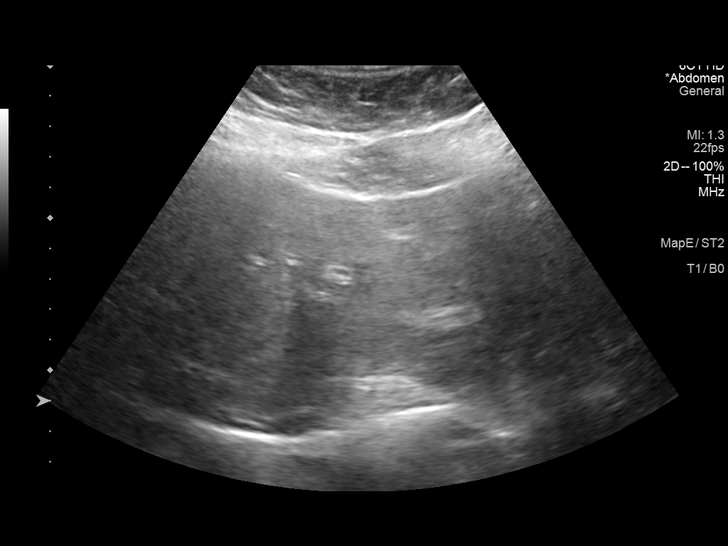
[im 32/43]
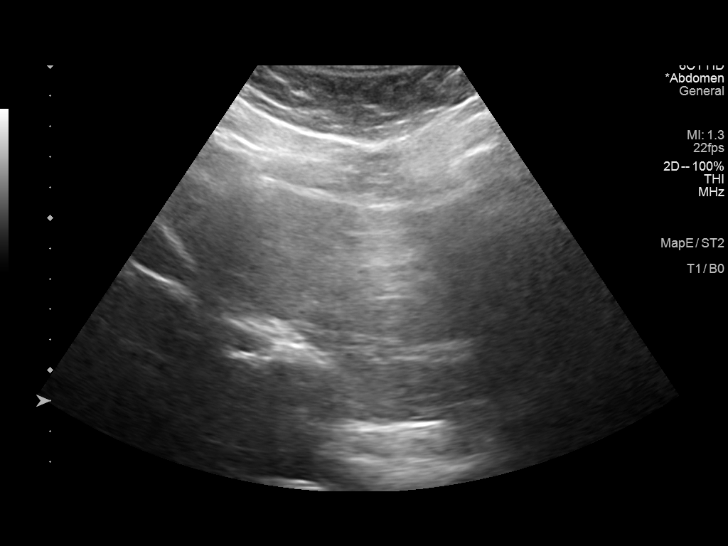
[im 36/43]
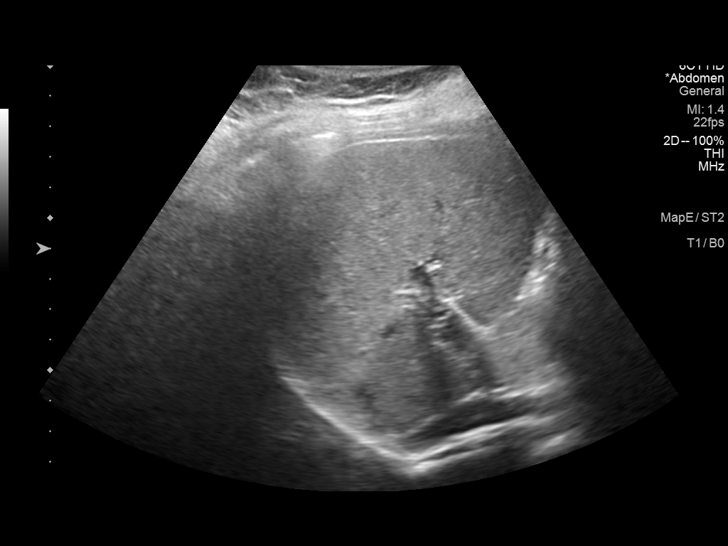
[im 39/43]
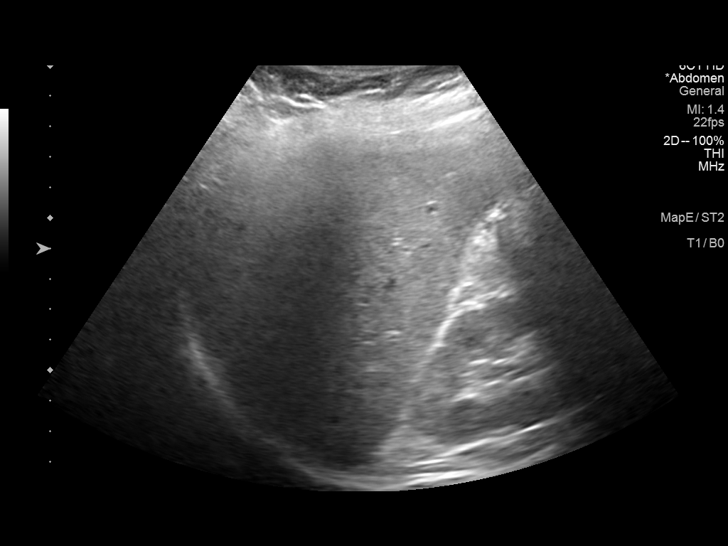
[im 43/43]
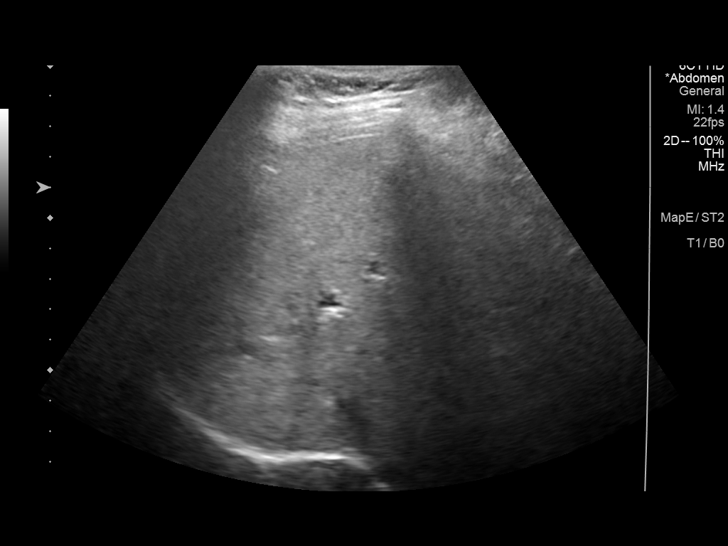

[14 of 25 positions shown; findings below may reference images not displayed]

FINDINGS: Gallbladder:

No gallstones or wall thickening visualized. No sonographic Murphy
sign noted by sonographer.

Common bile duct:

Diameter: 2 mm

Liver:

No focal lesion identified. Within normal limits in parenchymal
echogenicity. Portal vein is patent on color Doppler imaging with
normal direction of blood flow towards the liver.

Other: None.
IMPRESSION: No significant sonographic abnormality of the liver or gallbladder.
